# Patient Record
Sex: Female | Born: 1950 | Race: White | Hispanic: No | Marital: Married | Smoking: Never smoker
Health system: Southern US, Community
[De-identification: ages and names within clinical notes are randomized; demographics above are authoritative.]

## PROBLEM LIST (undated history)

## (undated) DIAGNOSIS — I10 Essential (primary) hypertension: Secondary | ICD-10-CM

## (undated) DIAGNOSIS — R519 Headache, unspecified: Secondary | ICD-10-CM

## (undated) DIAGNOSIS — E785 Hyperlipidemia, unspecified: Secondary | ICD-10-CM

## (undated) DIAGNOSIS — M1990 Unspecified osteoarthritis, unspecified site: Secondary | ICD-10-CM

## (undated) DIAGNOSIS — J329 Chronic sinusitis, unspecified: Secondary | ICD-10-CM

## (undated) DIAGNOSIS — Z923 Personal history of irradiation: Secondary | ICD-10-CM

## (undated) DIAGNOSIS — R0600 Dyspnea, unspecified: Secondary | ICD-10-CM

## (undated) DIAGNOSIS — R51 Headache: Secondary | ICD-10-CM

## (undated) DIAGNOSIS — K219 Gastro-esophageal reflux disease without esophagitis: Secondary | ICD-10-CM

## (undated) DIAGNOSIS — C50919 Malignant neoplasm of unspecified site of unspecified female breast: Secondary | ICD-10-CM

## (undated) DIAGNOSIS — Z8719 Personal history of other diseases of the digestive system: Secondary | ICD-10-CM

## (undated) HISTORY — PX: TONSILLECTOMY AND ADENOIDECTOMY: SHX28

## (undated) HISTORY — DX: Hyperlipidemia, unspecified: E78.5

## (undated) HISTORY — DX: Malignant neoplasm of unspecified site of unspecified female breast: C50.919

## (undated) HISTORY — PX: FINGER ARTHROPLASTY: SHX5017

## (undated) HISTORY — PX: CHOLECYSTECTOMY: SHX55

## (undated) HISTORY — PX: BREAST BIOPSY: SHX20

## (undated) HISTORY — PX: BREAST SURGERY: SHX581

## (undated) HISTORY — PX: TONSILLECTOMY: SUR1361

## (undated) HISTORY — PX: BUNIONECTOMY: SHX129

## (undated) HISTORY — PX: ABDOMINAL HYSTERECTOMY: SHX81

## (undated) HISTORY — DX: Unspecified osteoarthritis, unspecified site: M19.90

## (undated) HISTORY — DX: Chronic sinusitis, unspecified: J32.9

## (undated) HISTORY — DX: Gastro-esophageal reflux disease without esophagitis: K21.9

---

## 1999-08-05 HISTORY — PX: LASIK: SHX215

## 2008-08-04 HISTORY — PX: BREAST BIOPSY: SHX20

## 2010-08-04 DIAGNOSIS — C50919 Malignant neoplasm of unspecified site of unspecified female breast: Secondary | ICD-10-CM

## 2010-08-04 HISTORY — DX: Malignant neoplasm of unspecified site of unspecified female breast: C50.919

## 2010-08-04 HISTORY — PX: BREAST LUMPECTOMY: SHX2

## 2010-08-04 HISTORY — PX: BREAST BIOPSY: SHX20

## 2010-11-07 ENCOUNTER — Encounter: Payer: Self-pay | Admitting: Oncology

## 2012-01-27 ENCOUNTER — Telehealth: Payer: Self-pay | Admitting: *Deleted

## 2012-01-27 NOTE — Telephone Encounter (Signed)
Patient is moving from [place] to [place] and needs to obtain a Med Onc.  Requested Dr. Darnelle Catalan.  Confirmed 03/25/12 appt w/ pt.  Mailed before appt letter & packet to pt.  Took paperwork to Med Rec for chart.

## 2012-01-29 ENCOUNTER — Other Ambulatory Visit: Payer: Self-pay | Admitting: *Deleted

## 2012-01-29 DIAGNOSIS — C50919 Malignant neoplasm of unspecified site of unspecified female breast: Secondary | ICD-10-CM

## 2012-03-25 ENCOUNTER — Other Ambulatory Visit (HOSPITAL_BASED_OUTPATIENT_CLINIC_OR_DEPARTMENT_OTHER): Payer: BC Managed Care – PPO | Admitting: Lab

## 2012-03-25 ENCOUNTER — Ambulatory Visit (HOSPITAL_BASED_OUTPATIENT_CLINIC_OR_DEPARTMENT_OTHER): Payer: BC Managed Care – PPO | Admitting: Oncology

## 2012-03-25 ENCOUNTER — Ambulatory Visit: Payer: BC Managed Care – PPO

## 2012-03-25 VITALS — BP 149/83 | HR 87 | Temp 98.5°F | Resp 18 | Ht 64.5 in | Wt >= 6400 oz

## 2012-03-25 DIAGNOSIS — Z170 Estrogen receptor positive status [ER+]: Secondary | ICD-10-CM

## 2012-03-25 DIAGNOSIS — C50919 Malignant neoplasm of unspecified site of unspecified female breast: Secondary | ICD-10-CM

## 2012-03-25 LAB — CBC WITH DIFFERENTIAL/PLATELET
BASO%: 0.3 % (ref 0.0–2.0)
EOS%: 1.3 % (ref 0.0–7.0)
MCH: 29.1 pg (ref 25.1–34.0)
MCHC: 33.2 g/dL (ref 31.5–36.0)
RDW: 14.6 % — ABNORMAL HIGH (ref 11.2–14.5)
lymph#: 2.8 10*3/uL (ref 0.9–3.3)

## 2012-03-25 LAB — COMPREHENSIVE METABOLIC PANEL
ALT: 12 U/L (ref 0–35)
AST: 15 U/L (ref 0–37)
Albumin: 3.7 g/dL (ref 3.5–5.2)
Calcium: 9 mg/dL (ref 8.4–10.5)
Chloride: 104 mEq/L (ref 96–112)
Creatinine, Ser: 0.7 mg/dL (ref 0.50–1.10)
Potassium: 3.7 mEq/L (ref 3.5–5.3)

## 2012-03-25 LAB — CANCER ANTIGEN 27.29: CA 27.29: 5 U/mL (ref 0–39)

## 2012-03-25 MED ORDER — TAMOXIFEN CITRATE 20 MG PO TABS: 20.0000 mg | ORAL_TABLET | Freq: Every day | ORAL | Status: DC

## 2012-03-26 ENCOUNTER — Encounter: Payer: Self-pay | Admitting: Oncology

## 2012-03-26 DIAGNOSIS — C50912 Malignant neoplasm of unspecified site of left female breast: Secondary | ICD-10-CM | POA: Insufficient documentation

## 2012-03-26 NOTE — Progress Notes (Signed)
ID: [name]   DOB: Dec 02, 1950  MR#: [id number]  WUJ#:[id number]  HISTORY OF PRESENT ILLNESS: [place] Had routine screening mammography in February 2012 followed by ultrasonography confirming a suspicious abnormality in the left breast.. Needle guided biopsy was obtained and showed an invasive ductal carcinoma. 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  On 11/07/2010 the patient underwent left lumpectomy with sentinel lymph node sampling. The final pathology ([id number] at [facility] in [place]) showed an invasive ductal carcinoma, grade 1, measuring 7 mm, with ample margins and 0 of 3 sentinel lymph nodes for disease spread. The tumor was estrogen receptor positive at 95%, progesterone receptor negative, HER-2 negative at 1+ on the HercepTest.The patient received adjuvant radiation, then was started on letrozole, with poor tolerance(diarrhea, nausea, dizziness, and arthralgias). Tamoxifen was started may 2012, and she has tolerated this well.  INTERVAL HISTORY: The patient was seen in our clinic 03/25/2012, after moving to this area approximately 2 weeks prior of to live closer to her daughter and grandchildren.  REVIEW OF SYSTEMS: She is tolerating the tamoxifen with no side effects that she is aware of, and in particular she is having no problems with hot flashes. He has mild vaginal dryness which she does not describe as a major issue. She continues to have pain at the base of both palms. She also has a history of urinary tract infections, most recently April of 2013. Otherwise a detailed review of systems today was noncontributory.  PAST MEDICAL HISTORY: Past Medical History  Diagnosis Date  . Breast cancer   . Hyperlipidemia   . Osteoarthritis     thumbs  . GERD (gastroesophageal reflux disease)   . Osteopenia   . Chronic sinusitis     PAST SURGICAL HISTORY: Past Surgical  History  Procedure Date  . Tonsillectomy and adenoidectomy   . Bunionectomy   . Finger arthroplasty     thumb tendon replacement    FAMILY HISTORY No family history on file. The patient's father died at the age of 15 from a primary central nervous system tumor. The patient's mother died at age 62. Tamirra had no brothers. She has one sister. The only other cancers in the family were her father's mother, who died from colon cancer at the age of 9 and one maternal aunt who had breast cancer diagnosed at age 62 and bladder cancer at age 37.  GYNECOLOGIC HISTORY: Menarche age 53, first live birth age 27, she is GX P2. She went through menopause approximately 2001. She took hormone replacement for less than 2 years.  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 Her husband, [name]" L. Craghead, is a retired Animator. Daughter Etty Isaac lives in [place] and works as Armed forces logistics/support/administrative officer. Of Development for [employer]. She has 2 sons. Daughter Alvina Strother lives in [place] and works as a Furniture conservator/restorer for [employer]. The patient is a International aid/development worker.   ADVANCED DIRECTIVES: in place  HEALTH MAINTENANCE: History  Substance Use Topics  . Smoking status: Never Smoker   . Smokeless tobacco: Not on file  . Alcohol Use: No     Colonoscopy:  PAP: July 2013  Bone density:April of 2008, was normal  Lipid panel: July of 2013 showed a total cholesterol of 227 triglycerides 110 HDL 94 LDL 111  Allergies  Allergen Reactions  . Sulfa Antibiotics Itching and Rash  Current Outpatient Prescriptions  Medication Sig Dispense Refill  . cholecalciferol (VITAMIN D) 1000 UNITS tablet Take 1,000 Units by mouth daily.      Marland Kitchen esomeprazole (NEXIUM) 40 MG capsule Take 40 mg by mouth 3 (three) times a week.      . hydrOXYzine (ATARAX/VISTARIL) 25 MG tablet Take 25 mg by mouth daily.      . rosuvastatin (CRESTOR) 5 MG tablet Take 5 mg by mouth daily.      . tamoxifen (NOLVADEX)  20 MG tablet Take 1 tablet (20 mg total) by mouth daily.  90 tablet  12  . vitamin B-12 (CYANOCOBALAMIN) 100 MCG tablet Take 100 mcg by mouth daily.        OBJECTIVE:middle-aged white woman who appears well Filed Vitals:   03/25/12 1620  BP: 149/83  Pulse: 87  Temp: 98.5 F (36.9 C)  Resp: 18     Body mass index is 128.47 kg/(m^2).    ECOG FS: 0  Sclerae unicteric Oropharynx clear No cervical or supraclavicular adenopathy Lungs no rales or rhonchi Heart regular rate and rhythm Abd benign MSK no focal spinal tenderness, no peripheral edema Neuro: nonfocal Breasts: left breast is status post lumpectomy and radiation. There is no evidence of local recurrence. Right breast is unremarkable. Both axillae are benign.  LAB RESULTS: Lab Results  Component Value Date   WBC 6.4 03/25/2012   NEUTROABS 3.1 03/25/2012   HGB 13.0 03/25/2012   HCT 39.1 03/25/2012   MCV 87.6 03/25/2012   PLT 208 03/25/2012      Chemistry      Component Value Date/Time   NA 138 03/25/2012 1604   K 3.7 03/25/2012 1604   CL 104 03/25/2012 1604   CO2 25 03/25/2012 1604   BUN 9 03/25/2012 1604   CREATININE 0.70 03/25/2012 1604      Component Value Date/Time   CALCIUM 9.0 03/25/2012 1604   ALKPHOS 58 03/25/2012 1604   AST 15 03/25/2012 1604   ALT 12 03/25/2012 1604   BILITOT 0.2* 03/25/2012 1604       Lab Results  Component Value Date   LABCA2 5 03/25/2012    No components found with this basename: LABCA125    No results found for this basename: INR:1;PROTIME:1 in the last 168 hours  Urinalysis No results found for this basename: colorurine,  appearanceur,  labspec,  phurine,  glucoseu,  hgbur,  bilirubinur,  ketonesur,  proteinur,  urobilinogen,  nitrite,  leukocytesur    STUDIES: No additional results found.  ASSESSMENT: 61 y.o. woman recently moved to [place], [place], status post left lumpectomy and sentinel lymph node biopsy April of 2012 for a pT1 pN0, stage IA invasive ductal  carcinoma, grade 1, estrogen receptor positive, progesterone receptor and HER-2 negative, status post adjuvant radiation, briefly on letrozole with very poor tolerance, on tamoxifen since may of 2012 with good tolerance.  PLAN: we discussed her situation in detail and she understands with no adjuvant treatment of other than radiation her risk of death from this breast cancer would be in any case less than 3%. Her risk of recurrence would be in the 16% range according to the adjuvant! Database. I taking anti-estrogens she can cut that risk in half. We then discussed the multiple options available today and she is particularly interested in either taking tamoxifen for 5 years followed by an aromatase inhibitor for 5 years him a or continuing on tamoxifen for total of 10 years. I think either of these would be  very reasonable.  I have set her up for a transvaginal ultrasound pending her getting established with a gynecologist in this area. I refilled her tamoxifen prescription and again reviewed possible toxicities side effects and complications with her. She understands that if 5 little dryness is an issue, Vagifem suppositories would be reasonable in her situation. We will see her again in 6 months. Once she of has had a total of 2 years of followup, namely in the summer of next year, we will start seeing her on a once a year basis. Repeat mammography will be due August of 2014.   Aileena Iglesia C    03/26/2012

## 2012-03-29 ENCOUNTER — Telehealth: Payer: Self-pay | Admitting: Oncology

## 2012-03-29 NOTE — Telephone Encounter (Signed)
lmonvm advisng the pt of her US pelvis appt on 04/01/2012 along with the instructions.

## 2012-04-01 ENCOUNTER — Ambulatory Visit (HOSPITAL_COMMUNITY): Admission: RE | Admit: 2012-04-01 | Payer: BC Managed Care – PPO | Source: Ambulatory Visit

## 2012-04-01 ENCOUNTER — Ambulatory Visit (HOSPITAL_COMMUNITY)
Admission: RE | Admit: 2012-04-01 | Discharge: 2012-04-01 | Disposition: A | Discharge status: Home / Self Care | Payer: BC Managed Care – PPO | Source: Ambulatory Visit | Attending: Oncology | Admitting: Oncology

## 2012-04-01 DIAGNOSIS — R9389 Abnormal findings on diagnostic imaging of other specified body structures: Secondary | ICD-10-CM | POA: Insufficient documentation

## 2012-04-01 DIAGNOSIS — Z79899 Other long term (current) drug therapy: Secondary | ICD-10-CM | POA: Insufficient documentation

## 2012-04-01 DIAGNOSIS — C50919 Malignant neoplasm of unspecified site of unspecified female breast: Secondary | ICD-10-CM | POA: Insufficient documentation

## 2012-04-07 ENCOUNTER — Encounter: Payer: Self-pay | Admitting: *Deleted

## 2012-04-07 NOTE — Progress Notes (Signed)
Mailed after appt letter to pt. 

## 2012-04-11 ENCOUNTER — Other Ambulatory Visit: Payer: Self-pay | Admitting: Oncology

## 2012-04-19 ENCOUNTER — Other Ambulatory Visit: Payer: Self-pay | Admitting: *Deleted

## 2012-04-20 ENCOUNTER — Other Ambulatory Visit: Payer: Self-pay | Admitting: *Deleted

## 2012-05-04 ENCOUNTER — Encounter: Payer: Self-pay | Admitting: Obstetrics & Gynecology

## 2012-05-04 ENCOUNTER — Ambulatory Visit (INDEPENDENT_AMBULATORY_CARE_PROVIDER_SITE_OTHER): Payer: BC Managed Care – PPO | Admitting: Obstetrics & Gynecology

## 2012-05-04 VITALS — BP 148/96 | HR 102 | Temp 98.0°F | Resp 16 | Ht 64.0 in | Wt 160.0 lb

## 2012-05-04 DIAGNOSIS — IMO0002 Reserved for concepts with insufficient information to code with codable children: Secondary | ICD-10-CM

## 2012-05-04 DIAGNOSIS — R9389 Abnormal findings on diagnostic imaging of other specified body structures: Secondary | ICD-10-CM

## 2012-05-04 NOTE — Progress Notes (Signed)
  Subjective:    Patient ID: Kayla Spencer, female    DOB: [birth year], 61 y.o.   MRN: [id number]  HPI  61 year old female on Tamoxifen with baseline TV US by treating oncologist showing 5 mm thickening and questionable blood in cavity.  No postmenopausal bleeiding.    Review of Systems  Genitourinary: Positive for dyspareunia.  All other systems reviewed and are negative.       Objective:   Physical Exam  Vitals reviewed. Constitutional: She is oriented to person, place, and time. She appears well-developed and well-nourished.  HENT:  Head: Normocephalic and atraumatic.  Eyes: Conjunctivae normal are normal.  Pulmonary/Chest: Effort normal.  Abdominal: Soft. She exhibits no distension and no mass. There is no tenderness. There is no rebound and no guarding.  Genitourinary: Vagina normal.       Grade 2 prolapse.  No adnexal masses.  Musculoskeletal: She exhibits no edema.  Neurological: She is alert and oriented to person, place, and time.  Skin: Skin is warm and dry.  Psychiatric: She has a normal mood and affect. Her behavior is normal.          Assessment & Plan:  61 year old female with asymptomatic incidental finding of endometrial thickness of 5 mm on Tamoxifen  Discussed risks of endometrial hyperplasia, polyps, low positive predictive value of [place].  Pt would like to proceed with endometrial biopsy to confirm no hyperplasia or malignancy.  If biopsy returns negative then no further action is necessary unless she becomes symptomatic.  Thre is no evidence-based recommendations for uterine cancer screening in women taking tamoxifen.  ACOG recommend annual gyn exam for postmenopausal women.  Women should report any vaginal symptoms.

## 2012-09-20 ENCOUNTER — Other Ambulatory Visit: Payer: Self-pay | Admitting: *Deleted

## 2012-09-20 ENCOUNTER — Other Ambulatory Visit (HOSPITAL_BASED_OUTPATIENT_CLINIC_OR_DEPARTMENT_OTHER): Payer: BC Managed Care – PPO | Admitting: Lab

## 2012-09-20 DIAGNOSIS — C50919 Malignant neoplasm of unspecified site of unspecified female breast: Secondary | ICD-10-CM

## 2012-09-20 LAB — CBC WITH DIFFERENTIAL/PLATELET
BASO%: 0.4 % (ref 0.0–2.0)
Basophils Absolute: 0 10*3/uL (ref 0.0–0.1)
EOS%: 1.1 % (ref 0.0–7.0)
HGB: 13.9 g/dL (ref 11.6–15.9)
MCH: 28.2 pg (ref 25.1–34.0)
MCHC: 33.3 g/dL (ref 31.5–36.0)
MCV: 84.7 fL (ref 79.5–101.0)
MONO%: 5.3 % (ref 0.0–14.0)
NEUT%: 54.4 % (ref 38.4–76.8)
RDW: 14.8 % — ABNORMAL HIGH (ref 11.2–14.5)
lymph#: 2.7 10*3/uL (ref 0.9–3.3)

## 2012-09-20 LAB — COMPREHENSIVE METABOLIC PANEL (CC13)
Alkaline Phosphatase: 62 U/L (ref 40–150)
BUN: 12.8 mg/dL (ref 7.0–26.0)
Glucose: 88 mg/dl (ref 70–99)
Total Bilirubin: 0.32 mg/dL (ref 0.20–1.20)

## 2012-09-27 ENCOUNTER — Telehealth: Payer: Self-pay | Admitting: Oncology

## 2012-09-27 ENCOUNTER — Ambulatory Visit (HOSPITAL_BASED_OUTPATIENT_CLINIC_OR_DEPARTMENT_OTHER): Payer: BC Managed Care – PPO | Admitting: Oncology

## 2012-09-27 VITALS — BP 156/104 | HR 90 | Temp 98.3°F | Resp 20 | Ht 64.0 in | Wt 173.6 lb

## 2012-09-27 DIAGNOSIS — Z170 Estrogen receptor positive status [ER+]: Secondary | ICD-10-CM

## 2012-09-27 DIAGNOSIS — C50919 Malignant neoplasm of unspecified site of unspecified female breast: Secondary | ICD-10-CM

## 2012-09-27 DIAGNOSIS — M79609 Pain in unspecified limb: Secondary | ICD-10-CM

## 2012-09-27 NOTE — Telephone Encounter (Signed)
gv pt appt schedule for September and mammo appt @ [facility] for 8/5 @ 10:30am. S/w Kayla Spencer at the lymphedema clinic - Kayla Spencer has referral and will contact pt. Pt aware.

## 2012-09-27 NOTE — Progress Notes (Signed)
ID: [name]   DOB: [birth year]  MR#: [id number]  WUJ#:[id number]  PCP: Dorothey Baseman GYN: Elsie Lincoln  HISTORY OF PRESENT ILLNESS: Kayla Spencer routine screening mammography in February 2012 followed by ultrasonography confirming a suspicious abnormality in the left breast.. Needle guided biopsy was obtained and showed an invasive ductal carcinoma. 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  On 11/07/2010 the patient underwent left lumpectomy with sentinel lymph node sampling. The final pathology ([id number] at [facility] in [place]) showed an invasive ductal carcinoma, grade 1, measuring 7 mm, with ample margins and 0 of 3 sentinel lymph nodes for disease spread. The tumor was estrogen receptor positive at 95%, progesterone receptor negative, HER-2 negative at 1+ on the HercepTest.The patient received adjuvant radiation, then was started on letrozole, with poor tolerance(diarrhea, nausea, dizziness, and arthralgias). Tamoxifen was started may 2012, and she has tolerated this well.  INTERVAL HISTORY: Kayla Spencer returns for followup of her breast cancer. The interval history is unremarkable. They have completed their move to [facility] and are very please with the kind of in which they live. I have quite a bit of room and the yard. She exercises 4-5 times a week chiefly by walking, but on rainy days she makes it to the gym.  REVIEW OF SYSTEMS: Despite all this she is gaining weight. We discussed this at length today and she understands weight gain as well as 6 things at tends to happen to women at menopause. She has discomfort in the left axilla and the medial aspect of the left upper arm. This is going to be nerve damage secondary to her prior treatment. She has some hot flashes, which she does not think merit intervention. Otherwise a detailed review of systems today was  noncontributory  PAST MEDICAL HISTORY: Past Medical History  Diagnosis Date  . Breast cancer   . Hyperlipidemia   . Osteoarthritis     thumbs  . GERD (gastroesophageal reflux disease)   . Chronic sinusitis     PAST SURGICAL HISTORY: Past Surgical History  Procedure Laterality Date  . Tonsillectomy and adenoidectomy    . Bunionectomy    . Finger arthroplasty      thumb tendon replacement  . Breast surgery      lt lumpectomy x 2    FAMILY HISTORY Family History  Problem Relation Age of Onset  . Cancer Father     brain tumor  . Cancer Maternal Aunt     breast  . Cancer Paternal Grandmother     colon  . Heart disease Paternal Grandfather    The patient's father died at the age of 45 from a primary central nervous system tumor. The patient's mother died at age 62. Kayla Spencer Spencer no brothers. She has one sister. The only other cancers in the family were her father's mother, who died from colon cancer at the age of 27 and one maternal aunt who Spencer breast cancer diagnosed at age 39 and bladder cancer at age 64.  GYNECOLOGIC HISTORY: Menarche age 67, first live birth age 22, she is GX P2. She went through menopause approximately 2001. She took hormone replacement for less than 2 years.  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 Her husband, Kayla Spencer, is a retired Animator. Daughter Kayla Spencer lives in [place] and works as Armed forces logistics/support/administrative officer. Of Development for [employer]. She has 2 sons. Daughter  Kayla Spencer lives in [place] and works as a Furniture conservator/restorer for [employer]. The patient is a International aid/development worker.   ADVANCED DIRECTIVES: in place  HEALTH MAINTENANCE: History  Substance Use Topics  . Smoking status: Never Smoker   . Smokeless tobacco: Never Used  . Alcohol Use: No     Colonoscopy:  PAP: July 2013  Bone density:April of 2008, was normal  Lipid panel: July of 2013 showed a total cholesterol of 227 triglycerides 110 HDL 94 LDL  111  Allergies  Allergen Reactions  . Sulfa Antibiotics Itching and Rash    Current Outpatient Prescriptions  Medication Sig Dispense Refill  . cholecalciferol (VITAMIN D) 1000 UNITS tablet Take 1,000 Units by mouth daily.      Marland Kitchen esomeprazole (NEXIUM) 40 MG capsule Take 40 mg by mouth 3 (three) times a week.      Marland Kitchen FLUZONE injection       . hydrOXYzine (ATARAX/VISTARIL) 25 MG tablet Take 25 mg by mouth daily.      . rosuvastatin (CRESTOR) 5 MG tablet Take 5 mg by mouth daily.      . tamoxifen (NOLVADEX) 20 MG tablet Take 1 tablet (20 mg total) by mouth daily.  90 tablet  12  . vitamin B-12 (CYANOCOBALAMIN) 100 MCG tablet Take 100 mcg by mouth daily.       No current facility-administered medications for this visit.    OBJECTIVE:middle-aged white woman who appears well Filed Vitals:   09/27/12 1346  BP: 156/104  Pulse: 90  Temp: 98.3 F (36.8 C)  Resp: 20     Body mass index is 29.78 kg/(m^2).    ECOG FS: 0  Sclerae unicteric Oropharynx clear No cervical or supraclavicular adenopathy Lungs no rales or rhonchi Heart regular rate and rhythm Abd benign MSK no focal spinal tenderness, left upper extremity shows no edema, fair range of motion Neuro: nonfocal, well oriented, positive affect Breasts: left breast is status post lumpectomy and radiation. There is no evidence of local recurrence. The left axilla is benign Right breast is unremarkable.   LAB RESULTS: Lab Results  Component Value Date   WBC 7.0 09/20/2012   NEUTROABS 3.8 09/20/2012   HGB 13.9 09/20/2012   HCT 41.9 09/20/2012   MCV 84.7 09/20/2012   PLT 243 09/20/2012      Chemistry      Component Value Date/Time   NA 139 09/20/2012 1048   NA 138 03/25/2012 1604   K 4.3 09/20/2012 1048   K 3.7 03/25/2012 1604   CL 108* 09/20/2012 1048   CL 104 03/25/2012 1604   CO2 23 09/20/2012 1048   CO2 25 03/25/2012 1604   BUN 12.8 09/20/2012 1048   BUN 9 03/25/2012 1604   CREATININE 0.8 09/20/2012 1048   CREATININE 0.70  03/25/2012 1604      Component Value Date/Time   CALCIUM 9.1 09/20/2012 1048   CALCIUM 9.0 03/25/2012 1604   ALKPHOS 62 09/20/2012 1048   ALKPHOS 58 03/25/2012 1604   AST 22 09/20/2012 1048   AST 15 03/25/2012 1604   ALT 30 09/20/2012 1048   ALT 12 03/25/2012 1604   BILITOT 0.32 09/20/2012 1048   BILITOT 0.2* 03/25/2012 1604       Lab Results  Component Value Date   LABCA2 5 03/25/2012    No components found with this basename: [id number]    No results found for this basename: INR,  in the last 168 hours  Urinalysis No results found for  this basename: colorurine,  appearanceur,  labspec,  phurine,  glucoseu,  hgbur,  bilirubinur,  ketonesur,  proteinur,  urobilinogen,  nitrite,  leukocytesur    STUDIES: Collection Date: 05/04/2012 Patient Name: Kayla Spencer, Kayla Spencer Received Date: 05/05/2012 Case No: [id number] Physician cc: MRN #: [id number] Chart #: [id number] DOB: 22-May-1951 Age: 79 Gender: F Client Name: [facility] Physician: Elsie Lincoln, MD REPORT OF SURGICAL PATHOLOGY FINAL DIAGNOSIS Diagnosis Endometrium, biopsy - SCANT FRAGMENTS OF BENIGN ENDOCERVICAL MUCOSA, SEE COMMENT. - MINUTE SUPERFICIAL ENDOMETRIAL GLANDS AND STROMA; NO ATYPIA OR MALIGNANCY PRESENT. Microscopic Comment The specimen consists almost entirely of blood and fibrin. (CRR:eps 05/06/12) [place] RUND DO Pathologist, Electronic Signature (Case signed 05/06/2012) Specimen Gross and Clinical Information Specimen Comment Thickened endometrium on postmenopausal woman Specimen(s) Obtained: Endometrium, biopsy Gross Received in formalin are tan, hemorrhagic soft tissue fragments that are entirely submitted. Volume: 2.3 x 1.6 x 0.2 cm One block (OA:jes) Report signed out from the following location(s) [facility] [address],[place] [address].CLIA:[id number]., [facility] [address] [place], [place], [place] [address]. CLIA #:  [id number], 1 of  ASSESSMENT: 62 y.o. woman recently moved to [place], [place], status post left lumpectomy and sentinel lymph node biopsy April of 2012 for a pT1 pN0, stage IA invasive ductal carcinoma, grade 1, estrogen receptor positive, progesterone receptor and HER-2 negative, status post adjuvant radiation, briefly on letrozole with very poor tolerance, on tamoxifen since may of 2012 with good tolerance.  PLAN: There is no evidence of disease recurrence and she is tolerating the tamoxifen well. The plan is to continue tamoxifen for 5 years and then reassess whether we want to continuing to 10 or switch to an aromatase inhibitor.  I think she would benefit from going to the lymphedema clinic, chiefly to learn how to massage the left arm and some range of motion exercises. I have set that up. She will be due for mammography in August and she will have that at the [facility] here in town. Otherwise she will see [place] again in September and from that point we will start seeing her on a once a year basis. She knows to call for any problems that may develop before the next visit  Johnny Gorter C    09/27/2012

## 2012-10-06 ENCOUNTER — Ambulatory Visit: Payer: BC Managed Care – PPO | Attending: Oncology | Admitting: Physical Therapy

## 2012-10-06 DIAGNOSIS — M25619 Stiffness of unspecified shoulder, not elsewhere classified: Secondary | ICD-10-CM | POA: Insufficient documentation

## 2012-10-06 DIAGNOSIS — IMO0001 Reserved for inherently not codable concepts without codable children: Secondary | ICD-10-CM | POA: Insufficient documentation

## 2012-10-06 DIAGNOSIS — I890 Lymphedema, not elsewhere classified: Secondary | ICD-10-CM | POA: Insufficient documentation

## 2012-10-06 DIAGNOSIS — C50919 Malignant neoplasm of unspecified site of unspecified female breast: Secondary | ICD-10-CM | POA: Insufficient documentation

## 2012-10-12 ENCOUNTER — Ambulatory Visit: Payer: BC Managed Care – PPO

## 2012-10-12 ENCOUNTER — Other Ambulatory Visit: Payer: Self-pay | Admitting: *Deleted

## 2012-10-12 DIAGNOSIS — C50919 Malignant neoplasm of unspecified site of unspecified female breast: Secondary | ICD-10-CM

## 2012-10-12 NOTE — Progress Notes (Signed)
Pt called to this RN wanting to clarify when next mammogram should be done.  MD ordered for August of this year - but she received a letter from imaging center she obtained last mammogram stating need to be done in March 2014. Last mammogram was March 2013.  This RN placed request for mammo to be rescheduled to March and sent to scheduling.  Due to transfer of care from [place]- this RN faxed mammo order with prior reports to [place].  All the above discussed with pt.

## 2012-10-14 ENCOUNTER — Ambulatory Visit: Payer: BC Managed Care – PPO | Admitting: Physical Therapy

## 2012-10-18 ENCOUNTER — Telehealth: Payer: Self-pay | Admitting: Oncology

## 2012-10-18 NOTE — Telephone Encounter (Signed)
S/w the pt and she is aware of her march mammogram appt that has been r/s from aug to march at the [facility] women[facility] clinic.

## 2012-10-19 ENCOUNTER — Ambulatory Visit: Payer: BC Managed Care – PPO | Admitting: Physical Therapy

## 2012-10-21 ENCOUNTER — Ambulatory Visit: Payer: BC Managed Care – PPO

## 2012-10-26 ENCOUNTER — Ambulatory Visit: Payer: BC Managed Care – PPO | Admitting: Physical Therapy

## 2012-10-28 ENCOUNTER — Ambulatory Visit: Payer: BC Managed Care – PPO

## 2012-11-02 ENCOUNTER — Ambulatory Visit: Payer: BC Managed Care – PPO | Attending: Oncology

## 2012-11-02 DIAGNOSIS — I890 Lymphedema, not elsewhere classified: Secondary | ICD-10-CM | POA: Insufficient documentation

## 2012-11-02 DIAGNOSIS — C50919 Malignant neoplasm of unspecified site of unspecified female breast: Secondary | ICD-10-CM | POA: Insufficient documentation

## 2012-11-02 DIAGNOSIS — IMO0001 Reserved for inherently not codable concepts without codable children: Secondary | ICD-10-CM | POA: Insufficient documentation

## 2012-11-02 DIAGNOSIS — M25619 Stiffness of unspecified shoulder, not elsewhere classified: Secondary | ICD-10-CM | POA: Insufficient documentation

## 2012-11-04 ENCOUNTER — Ambulatory Visit: Payer: BC Managed Care – PPO | Admitting: Physical Therapy

## 2013-03-07 ENCOUNTER — Telehealth: Payer: Self-pay | Admitting: *Deleted

## 2013-03-16 ENCOUNTER — Telehealth: Payer: Self-pay | Admitting: *Deleted

## 2013-03-16 NOTE — Telephone Encounter (Signed)
Patient called and left message that she had previously called [place] on 8/4 and needed her mammogram scheduled. Attempted to call patient back to see if there are any new findings, otherwise her annual mammo is scheduled for March 2015. Left the number for [facility] for patient to schedule this, if there are problems or new findings, then she should call us back and speak with nurse.

## 2013-03-17 ENCOUNTER — Other Ambulatory Visit: Payer: Self-pay | Admitting: *Deleted

## 2013-03-18 ENCOUNTER — Other Ambulatory Visit: Payer: Self-pay | Admitting: *Deleted

## 2013-03-18 DIAGNOSIS — C50919 Malignant neoplasm of unspecified site of unspecified female breast: Secondary | ICD-10-CM

## 2013-03-18 NOTE — Progress Notes (Signed)
This RN spoke with pt per her call earlier with concern that she thought she was to obtain a 2nd mammogram before MD appointment in September.  [place]ez had mammogram in April with recommendation for repeat in 1 year.  Per discussion Deaisha states she was given an appointment for mammogram in August 2014 ( though she was already scheduled for April of same year ).  " Now I am being told that insurance won't pay but I thought Dr Darnelle Catalan wanted me to have it again ".  This RN reviewed pt's chart - and informed her above was a clerical misunderstanding - mammogram are done yearly unless she is having new issues of concern. Per inquiry pt denies any new issues " except I guess just my anxiety ".  This RN validated pt's concern with discussion of availability to call this office if new concerns arise that warranted additional scans.  Presently pt is scheduled for follow up in September. Of note pt also asked about obtaining a CA 27.29 due to previous testing.  Order will be placed for CA 27.29.

## 2013-04-04 ENCOUNTER — Emergency Department: Payer: Self-pay | Admitting: Emergency Medicine

## 2013-04-04 LAB — COMPREHENSIVE METABOLIC PANEL
Alkaline Phosphatase: 74 U/L (ref 50–136)
Anion Gap: 8 (ref 7–16)
Bilirubin,Total: 0.3 mg/dL (ref 0.2–1.0)
Chloride: 111 mmol/L — ABNORMAL HIGH (ref 98–107)
EGFR (African American): >60
EGFR (Non-African Amer.): >60
Glucose: 135 mg/dL — ABNORMAL HIGH (ref 65–99)
SGOT(AST): 40 U/L — ABNORMAL HIGH (ref 15–37)
Total Protein: 6.3 g/dL — ABNORMAL LOW (ref 6.4–8.2)

## 2013-04-04 LAB — CK TOTAL AND CKMB (NOT AT ARMC): CK, Total: 46 U/L (ref 21–215)

## 2013-04-04 LAB — PROTIME-INR: INR: 0.9

## 2013-04-04 LAB — CBC WITH DIFFERENTIAL/PLATELET
Basophil #: 0 10*3/uL (ref 0.0–0.1)
Eosinophil #: 0.1 10*3/uL (ref 0.0–0.7)
HCT: 40.1 % (ref 35.0–47.0)
HGB: 13.4 g/dL (ref 12.0–16.0)
Lymphocyte %: 22.9 %
MCH: 28.6 pg (ref 26.0–34.0)
MCHC: 33.3 g/dL (ref 32.0–36.0)
MCV: 86 fL (ref 80–100)
Neutrophil %: 71.6 %
WBC: 9.4 10*3/uL (ref 3.6–11.0)

## 2013-04-04 LAB — LIPASE, BLOOD: Lipase: 361 U/L (ref 73–393)

## 2013-04-04 LAB — TROPONIN I
Troponin-I: <0.02 ng/mL
Troponin-I: <0.02 ng/mL

## 2013-04-18 ENCOUNTER — Encounter: Payer: Self-pay | Admitting: Physician Assistant

## 2013-04-18 ENCOUNTER — Telehealth: Payer: Self-pay | Admitting: *Deleted

## 2013-04-18 ENCOUNTER — Other Ambulatory Visit (HOSPITAL_BASED_OUTPATIENT_CLINIC_OR_DEPARTMENT_OTHER): Payer: BC Managed Care – PPO | Admitting: Lab

## 2013-04-18 ENCOUNTER — Ambulatory Visit (HOSPITAL_BASED_OUTPATIENT_CLINIC_OR_DEPARTMENT_OTHER): Payer: BC Managed Care – PPO | Admitting: Physician Assistant

## 2013-04-18 VITALS — BP 149/91 | HR 89 | Temp 98.2°F | Resp 18 | Ht 64.0 in | Wt 164.8 lb

## 2013-04-18 DIAGNOSIS — Z853 Personal history of malignant neoplasm of breast: Secondary | ICD-10-CM

## 2013-04-18 DIAGNOSIS — C50912 Malignant neoplasm of unspecified site of left female breast: Secondary | ICD-10-CM

## 2013-04-18 DIAGNOSIS — C50919 Malignant neoplasm of unspecified site of unspecified female breast: Secondary | ICD-10-CM

## 2013-04-18 LAB — COMPREHENSIVE METABOLIC PANEL (CC13)
AST: 14 U/L (ref 5–34)
Alkaline Phosphatase: 54 U/L (ref 40–150)
BUN: 9.6 mg/dL (ref 7.0–26.0)
Calcium: 9.2 mg/dL (ref 8.4–10.4)
Creatinine: 0.7 mg/dL (ref 0.6–1.1)

## 2013-04-18 LAB — CBC WITH DIFFERENTIAL/PLATELET
Basophils Absolute: 0 10*3/uL (ref 0.0–0.1)
Eosinophils Absolute: 0.1 10*3/uL (ref 0.0–0.5)
HGB: 13.3 g/dL (ref 11.6–15.9)
LYMPH%: 39.3 % (ref 14.0–49.7)
MCV: 85.9 fL (ref 79.5–101.0)
MONO%: 5.9 % (ref 0.0–14.0)
NEUT#: 3.9 10*3/uL (ref 1.5–6.5)
Platelets: 271 10*3/uL (ref 145–400)
RDW: 14.5 % (ref 11.2–14.5)

## 2013-04-18 NOTE — Telephone Encounter (Signed)
appts made and printed. gv appt for [facility]...td 

## 2013-04-18 NOTE — Progress Notes (Signed)
ID: [name]   DOB: 1950/11/07  MR#: [id number]  WUX#:[id number]  PCP: Dorothey Baseman GYN: Elsie Lincoln   CHIEF COMPLAINT:  Left breast cancer   HISTORY OF PRESENT ILLNESS: Kayla Spencer Had routine screening mammography in February 2012 followed by ultrasonography confirming a suspicious abnormality in the left breast.. Needle guided biopsy was obtained and showed an invasive ductal carcinoma. 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  On 11/07/2010 the patient underwent left lumpectomy with sentinel lymph node sampling. The final pathology ([id number] at [facility] in [place]) showed an invasive ductal carcinoma, grade 1, measuring 7 mm, with ample margins and 0 of 3 sentinel lymph nodes for disease spread. The tumor was estrogen receptor positive at 95%, progesterone receptor negative, HER-2 negative at 1+ on the HercepTest.The patient received adjuvant radiation, then was started on letrozole, with poor tolerance(diarrhea, nausea, dizziness, and arthralgias). Tamoxifen was started may 2012, and she has tolerated this well.  INTERVAL HISTORY: Kayla Spencer returns alone today for followup of her left breast cancer.  She continues on tamoxifen which she is tolerating well.   The interval history is notable for the fact that Kayla Spencer was seen in the emergency room at [facility] on 04/04/2013 with complaints of chest pain at 4:30 AM. She was evaluated thoroughly. They did not find this pain to be associated with any cardiac problems, and she has been referred to a surgeon for further evaluation of her gallbladder. In fact she is scheduled for nuclear testing of the gallbladder next week. She's had no significant episodes of abdominal pain since that time, although her abdomen is slightly tender to palpation. She's had no nausea or emesis, and is having regular bowel movements.  Kayla Spencer is very busy,  and exercises on a regular basis. She travels quite a bit, and since her appointment here in February, has been to [place] and also to [facility].   REVIEW OF SYSTEMS: Kayla Spencer denies any recent fevers or chills. She has no significant hot flashes. Her energy level is good. She's had no skin changes, rashes, abnormal bruising, or abnormal bleeding. She denies any vaginal dryness, discharge, or bleeding. She's eating well and has a good appetite. She's had no cough, shortness of breath, or additional chest pain. She denies any abnormal headaches, change in vision, or dizziness. She also denies any new or unusual myalgias, arthralgias, bony pain, or peripheral swelling.  A detailed review of systems is otherwise stable and noncontributory.   PAST MEDICAL HISTORY: Past Medical History  Diagnosis Date  . Breast cancer   . Hyperlipidemia   . Osteoarthritis     thumbs  . GERD (gastroesophageal reflux disease)   . Chronic sinusitis     PAST SURGICAL HISTORY: Past Surgical History  Procedure Laterality Date  . Tonsillectomy and adenoidectomy    . Bunionectomy    . Finger arthroplasty      thumb tendon replacement  . Breast surgery      lt lumpectomy x 2    FAMILY HISTORY Family History  Problem Relation Age of Onset  . Cancer Father     brain tumor  . Cancer Maternal Aunt     breast  . Cancer Paternal Grandmother     colon  . Heart disease Paternal Grandfather    The patient's father died at the age of 105 from a primary central nervous system tumor. The patient's mother died at  age 80. Kayla Spencer had no brothers. She has one sister. The only other cancers in the family were her father's mother, who died from colon cancer at the age of 38 and one maternal aunt who had breast cancer diagnosed at age 52 and bladder cancer at age 37.  GYNECOLOGIC HISTORY: Menarche age 41, first live birth age 30, she is [place] P2. She went through menopause approximately 2001. She took hormone replacement  for less than 2 years.  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 Her husband, Kayla Kuba" L. Spencer, is a retired Animator. Daughter Kayla Spencer lives in [place] and works as Armed forces logistics/support/administrative officer. Of Development for [employer]. She has 2 sons. Daughter Kayla Spencer lives in [place] and works as a Furniture conservator/restorer for [employer]. The patient is a International aid/development worker.   ADVANCED DIRECTIVES: in place  HEALTH MAINTENANCE: History  Substance Use Topics  . Smoking status: Never Smoker   . Smokeless tobacco: Never Used  . Alcohol Use: No     Colonoscopy:  PAP: July 2013  Bone density:April of 2008, was normal  Lipid panel: July of 2013 showed a total cholesterol of 227 triglycerides 110 HDL 94 LDL 111  Allergies  Allergen Reactions  . Sulfa Antibiotics Itching and Rash    Current Outpatient Prescriptions  Medication Sig Dispense Refill  . Biotin 1000 MCG tablet Take 1,000 mcg by mouth daily.      . cholecalciferol (VITAMIN D) 1000 UNITS tablet Take 1,000 Units by mouth daily.      . diclofenac (VOLTAREN) 75 MG EC tablet Take 75 mg by mouth 2 (two) times daily as needed.      Marland Kitchen esomeprazole (NEXIUM) 40 MG capsule Take 40 mg by mouth 3 (three) times a week.      . hydrOXYzine (ATARAX/VISTARIL) 25 MG tablet Take 25 mg by mouth daily.      . tamoxifen (NOLVADEX) 20 MG tablet Take 1 tablet (20 mg total) by mouth daily.  90 tablet  12  . vitamin B-12 (CYANOCOBALAMIN) 100 MCG tablet Take 100 mcg by mouth daily.       No current facility-administered medications for this visit.    OBJECTIVE:middle-aged white woman who appears well Filed Vitals:   04/18/13 1457  BP: 149/91  Pulse: 89  Temp: 98.2 F (36.8 C)  Resp: 18     Body mass index is 28.27 kg/(m^2).    ECOG FS: 0 Filed Weights   04/18/13 1457  Weight: 164 lb 12.8 oz (74.753 kg)   Sclerae unicteric Oropharynx clear, buccal mucosa is pink and moist No cervical or supraclavicular adenopathy Lungs clear to  auscultation bilaterally with good excursion, no rales or rhonchi Heart regular rate and rhythm, no murmur appreciated Abd soft, mild tenderness to palpation in the right upper quadrant, but no organomegaly or masses palpated, positive bowel sounds. MSK no focal spinal tenderness, No peripheral edema Neuro: nonfocal, well oriented, positive affect Breasts: left breast is status post lumpectomy and radiation. Other than normal scar tissue around the incision, there no abnormalities, and the left breast is unremarkable. There is no evidence of local recurrence.  Right breast is unremarkable. Axillae are benign bilaterally with no palpable adenopathy  LAB RESULTS: Lab Results  Component Value Date   WBC 7.3 04/18/2013   NEUTROABS 3.9 04/18/2013   HGB 13.3 04/18/2013   HCT 40.7 04/18/2013   MCV 85.9 04/18/2013   PLT 271 04/18/2013      Chemistry  Component Value Date/Time   NA 141 04/18/2013 1442   NA 138 03/25/2012 1604   K 3.9 04/18/2013 1442   K 3.7 03/25/2012 1604   CL 108* 09/20/2012 1048   CL 104 03/25/2012 1604   CO2 24 04/18/2013 1442   CO2 25 03/25/2012 1604   BUN 9.6 04/18/2013 1442   BUN 9 03/25/2012 1604   CREATININE 0.7 04/18/2013 1442   CREATININE 0.70 03/25/2012 1604      Component Value Date/Time   CALCIUM 9.2 04/18/2013 1442   CALCIUM 9.0 03/25/2012 1604   ALKPHOS 54 04/18/2013 1442   ALKPHOS 58 03/25/2012 1604   AST 14 04/18/2013 1442   AST 15 03/25/2012 1604   ALT 13 04/18/2013 1442   ALT 12 03/25/2012 1604   BILITOT 0.27 04/18/2013 1442   BILITOT 0.2* 03/25/2012 1604       Lab Results  Component Value Date   LABCA2 5 03/25/2012   CA 27.29 was repeated today, 04/18/2013, per patient request. Results are pending.   STUDIES:  Most recent bilateral mammogram at [facility] on 10/26/2012 was unremarkable. These results were reviewed with the patient today.   ASSESSMENT: 62 y.o. woman recently moved from [place] to [place], [place]  (1)   status post left  lumpectomy and sentinel lymph node biopsy April of 2012 for a pT1 pN0, stage IA invasive ductal carcinoma, grade 1, estrogen receptor positive, progesterone receptor and HER-2 negative,   (2)  status post adjuvant radiation   (3)  briefly on letrozole with very poor tolerance, on tamoxifen since May of 2012 with good tolerance.   PLAN:  With regards to her breast cancer, Kayla Spencer appears to be doing well, and there is no clinical evidence of disease recurrence. She'll continue on tamoxifen. Our plan is to continue for at least 5 years, then reevaluate whether to continue for 10 years.  Kayla Spencer will be due for her next mammogram at [facility] in late March 2015. We will see her soon thereafter in April for routine followup, and if she is doing as well then as she is now, we will likely go to annual visits.  All this was reviewed in detail with Kayla Spencer today, and she voices understanding and agreement with our plan.  She will call with any changes or problems prior to her next scheduled visit.  Kayla Spencer    04/18/2013

## 2013-04-25 ENCOUNTER — Ambulatory Visit: Payer: Self-pay | Admitting: Surgery

## 2013-05-10 ENCOUNTER — Ambulatory Visit: Payer: Self-pay | Admitting: Surgery

## 2013-05-12 LAB — PATHOLOGY REPORT

## 2013-06-07 ENCOUNTER — Ambulatory Visit (INDEPENDENT_AMBULATORY_CARE_PROVIDER_SITE_OTHER): Payer: BC Managed Care – PPO | Admitting: Obstetrics & Gynecology

## 2013-06-07 ENCOUNTER — Encounter: Payer: Self-pay | Admitting: Obstetrics & Gynecology

## 2013-06-07 VITALS — BP 155/87 | HR 79 | Resp 16 | Ht 64.0 in | Wt 160.0 lb

## 2013-06-07 DIAGNOSIS — Z124 Encounter for screening for malignant neoplasm of cervix: Secondary | ICD-10-CM

## 2013-06-07 DIAGNOSIS — I10 Essential (primary) hypertension: Secondary | ICD-10-CM

## 2013-06-07 DIAGNOSIS — Z01419 Encounter for gynecological examination (general) (routine) without abnormal findings: Secondary | ICD-10-CM

## 2013-06-07 DIAGNOSIS — IMO0001 Reserved for inherently not codable concepts without codable children: Secondary | ICD-10-CM | POA: Insufficient documentation

## 2013-06-07 DIAGNOSIS — R350 Frequency of micturition: Secondary | ICD-10-CM

## 2013-06-07 DIAGNOSIS — Z1151 Encounter for screening for human papillomavirus (HPV): Secondary | ICD-10-CM

## 2013-06-07 NOTE — Progress Notes (Signed)
  Subjective:    Kayla Spencer is a 62 y.o. female who presents for an annual exam. The patient has no complaints today. The patient is sexually active. GYN screening history: last pap: was normal. The patient wears seatbelts: yes. The patient participates in regular exercise: yes. Has the patient ever been transfused or tattooed?: no. The patient reports that there is not domestic violence in her life.   Menstrual History: OB History   Grav Para Term Preterm Abortions TAB SAB Ect Mult Living   2 2 2       2       No LMP recorded. Patient is postmenopausal.    The following portions of the patient's history were reviewed and updated as appropriate: allergies, current medications, past family history, past medical history, past social history, past surgical history and problem list.  Review of Systems A comprehensive review of systems was negative.  No vaginal bleeding on Tamoxifen.   Objective:      Filed Vitals:   06/07/13 1422  BP: 155/87  Pulse: 79  Resp: 16  Height: 5\' 4"  (1.626 m)  Weight: 160 lb (72.576 kg)   Vitals:  WNL General appearance: alert, cooperative and no distress Head: Normocephalic, without obvious abnormality, atraumatic Eyes: negative Throat: lips, mucosa, and tongue normal; teeth and gums normal Lungs: clear to auscultation bilaterally Breasts: normal appearance, no masses or tenderness, No nipple retraction or dimpling, No nipple discharge or bleeding Heart: regular rate and rhythm Abdomen: soft, non-tender; bowel sounds normal; no masses,  no organomegaly Pelvic: cervix normal in appearance, external genitalia normal, no adnexal masses or tenderness, no bladder tenderness, no cervical motion tenderness, perianal skin: no external genital warts noted, rectovaginal septum normal, urethra without abnormality or discharge, uterus normal size, shape, and consistency and vagina normal without discharge with pal pink mucosa Extremities: no edema, redness or  tenderness in the calves or thighs Skin: no lesions or rash Lymph nodes: Axillary adenopathy: none     .    Assessment:    Healthy female exam.    Plan:     Pap smear. with HPV testing Followed by oncology for breast cancer Primary care MD for other screening.

## 2013-06-08 LAB — CULTURE, URINE COMPREHENSIVE
Colony Count: NO GROWTH
Organism ID, Bacteria: NO GROWTH

## 2013-06-09 ENCOUNTER — Telehealth: Payer: Self-pay | Admitting: *Deleted

## 2013-06-09 NOTE — Telephone Encounter (Signed)
Pt notified via phone of neg urine culture

## 2013-06-20 ENCOUNTER — Other Ambulatory Visit: Payer: Self-pay | Admitting: Oncology

## 2013-06-20 DIAGNOSIS — C50919 Malignant neoplasm of unspecified site of unspecified female breast: Secondary | ICD-10-CM

## 2013-10-31 ENCOUNTER — Other Ambulatory Visit (HOSPITAL_BASED_OUTPATIENT_CLINIC_OR_DEPARTMENT_OTHER): Payer: BC Managed Care – PPO

## 2013-10-31 DIAGNOSIS — Z853 Personal history of malignant neoplasm of breast: Secondary | ICD-10-CM

## 2013-10-31 DIAGNOSIS — C50912 Malignant neoplasm of unspecified site of left female breast: Secondary | ICD-10-CM

## 2013-10-31 LAB — CBC WITH DIFFERENTIAL/PLATELET
BASO%: 0.7 % (ref 0.0–2.0)
Basophils Absolute: 0 10*3/uL (ref 0.0–0.1)
EOS%: 1.9 % (ref 0.0–7.0)
Eosinophils Absolute: 0.1 10*3/uL (ref 0.0–0.5)
HCT: 42.8 % (ref 34.8–46.6)
HGB: 14 g/dL (ref 11.6–15.9)
LYMPH%: 36.3 % (ref 14.0–49.7)
MCH: 27.7 pg (ref 25.1–34.0)
MCHC: 32.7 g/dL (ref 31.5–36.0)
MCV: 84.8 fL (ref 79.5–101.0)
MONO#: 0.3 10*3/uL (ref 0.1–0.9)
MONO%: 6.7 % (ref 0.0–14.0)
NEUT#: 2.6 10*3/uL (ref 1.5–6.5)
NEUT%: 54.4 % (ref 38.4–76.8)
Platelets: 174 10*3/uL (ref 145–400)
RBC: 5.05 10*6/uL (ref 3.70–5.45)
RDW: 15.1 % — ABNORMAL HIGH (ref 11.2–14.5)
WBC: 4.8 10*3/uL (ref 3.9–10.3)
lymph#: 1.7 10*3/uL (ref 0.9–3.3)

## 2013-10-31 LAB — COMPREHENSIVE METABOLIC PANEL (CC13)
ALT: 9 U/L (ref 0–55)
ANION GAP: 9 meq/L (ref 3–11)
AST: 11 U/L (ref 5–34)
Albumin: 3.4 g/dL — ABNORMAL LOW (ref 3.5–5.0)
Alkaline Phosphatase: 73 U/L (ref 40–150)
BILIRUBIN TOTAL: 0.28 mg/dL (ref 0.20–1.20)
BUN: 11.4 mg/dL (ref 7.0–26.0)
CO2: 20 meq/L — AB (ref 22–29)
CREATININE: 0.9 mg/dL (ref 0.6–1.1)
Calcium: 8.8 mg/dL (ref 8.4–10.4)
Chloride: 110 mEq/L — ABNORMAL HIGH (ref 98–109)
Glucose: 162 mg/dl — ABNORMAL HIGH (ref 70–140)
Potassium: 3.7 mEq/L (ref 3.5–5.1)
Sodium: 140 mEq/L (ref 136–145)
Total Protein: 7.4 g/dL (ref 6.4–8.3)

## 2013-11-07 ENCOUNTER — Ambulatory Visit (HOSPITAL_BASED_OUTPATIENT_CLINIC_OR_DEPARTMENT_OTHER): Payer: BC Managed Care – PPO | Admitting: Oncology

## 2013-11-07 ENCOUNTER — Telehealth: Payer: Self-pay | Admitting: Physician Assistant

## 2013-11-07 ENCOUNTER — Other Ambulatory Visit: Payer: BC Managed Care – PPO | Admitting: Lab

## 2013-11-07 VITALS — BP 148/87 | HR 84 | Temp 98.5°F | Resp 18 | Ht 64.0 in | Wt 163.4 lb

## 2013-11-07 DIAGNOSIS — Z170 Estrogen receptor positive status [ER+]: Secondary | ICD-10-CM

## 2013-11-07 DIAGNOSIS — IMO0001 Reserved for inherently not codable concepts without codable children: Secondary | ICD-10-CM

## 2013-11-07 DIAGNOSIS — C50919 Malignant neoplasm of unspecified site of unspecified female breast: Secondary | ICD-10-CM

## 2013-11-07 MED ORDER — TAMOXIFEN CITRATE 20 MG PO TABS
20.0000 mg | ORAL_TABLET | Freq: Every day | ORAL | Status: DC
Start: 2013-11-07 — End: 2014-11-08

## 2013-11-07 NOTE — Progress Notes (Signed)
ID: Demetrios Isaacs   DOB: Dec 02, 1950  MR#: [id number]  WUJ#:[id number]  PCP: Juluis Pitch GYN: Silas Sacramento SU: Bobette Mo   CHIEF COMPLAINT:  Left breast cancer   HISTORY OF PRESENT ILLNESS: Amandalee Had routine screening mammography in February 2012 followed by ultrasonography confirming a suspicious abnormality in the left breast.. Needle guided biopsy was obtained and showed an invasive ductal carcinoma. 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  On 11/07/2010 the patient underwent left lumpectomy with sentinel lymph node sampling. The final pathology ([id number] at [facility] in [place]) showed an invasive ductal carcinoma, grade 1, measuring 7 mm, with ample margins and 0 of 3 sentinel lymph nodes for disease spread. The tumor was estrogen receptor positive at 95%, progesterone receptor negative, HER-2 negative at 1+ on the HercepTest.The patient received adjuvant radiation, then was started on letrozole, with poor tolerance(diarrhea, nausea, dizziness, and arthralgias). Tamoxifen was started may 2012, and she has tolerated this well.  INTERVAL HISTORY: Kaiana returns today for followup of her low risk breast cancer. The interval history is unremarkable. She is tolerating the tamoxifen with no side effects it she is aware of including no hot flashes and no vaginal "stickiness". Since her last visit here she did have her gallbladder removed. There were no complications  REVIEW OF SYSTEMS: Thayer Headings d has mild arthritic discomforts here in there, including a the right pretibial discomfort which has been evaluated by vascular surgery. She is in the process of referral to orthopedics. This doesn't keep her from walking though and she walks 20-40 minutes almost every day of the week. A detailed review of systems today was otherwise entirely negative  PAST MEDICAL HISTORY: Past  Medical History  Diagnosis Date  . Breast cancer   . Hyperlipidemia   . Osteoarthritis     thumbs  . GERD (gastroesophageal reflux disease)   . Chronic sinusitis     PAST SURGICAL HISTORY: Past Surgical History  Procedure Laterality Date  . Tonsillectomy and adenoidectomy    . Bunionectomy    . Finger arthroplasty      thumb tendon replacement  . Breast surgery      lt lumpectomy x 2  . Cholecystectomy      FAMILY HISTORY Family History  Problem Relation Age of Onset  . Cancer Father     brain tumor  . Cancer Maternal Aunt     breast  . Cancer Paternal Grandmother     colon  . Heart disease Paternal Grandfather    The patient's father died at the age of 45 from a primary central nervous system tumor. The patient's mother died at age 26. Ilya had no brothers. She has one sister. The only other cancers in the family were her father's mother, who died from colon cancer at the age of 78 and one maternal aunt who had breast cancer diagnosed at age 51 and bladder cancer at age 72.  GYNECOLOGIC HISTORY: Menarche age 16, first live birth age 56, she is [place] P2. She went through menopause approximately 2001. She took hormone replacement for less than 2 years.  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 Her husband, Nicolette Bang" L. Fuerstenberg, is a retired Electronics engineer. Daughter Letishia Elliott lives in [place] and works as Dealer. Of Development for [employer]. She has 2 sons. Daughter Marialice Newkirk lives in [place] and works as a  claims supervisor for [employer]. The patient is a Tourist information centre manager.   ADVANCED DIRECTIVES: in place  HEALTH MAINTENANCE: History  Substance Use Topics  . Smoking status: Never Smoker   . Smokeless tobacco: Never Used  . Alcohol Use: No     Colonoscopy:  PAP: July 2013  Bone density:April of 2008, was normal  Lipid panel: July of 2013 showed a total cholesterol of 227 triglycerides 110 HDL 94 LDL 111  Allergies  Allergen  Reactions  . Sulfa Antibiotics Itching and Rash    Current Outpatient Prescriptions  Medication Sig Dispense Refill  . Biotin 1000 MCG tablet Take 1,000 mcg by mouth daily.      . cholecalciferol (VITAMIN D) 1000 UNITS tablet Take 1,000 Units by mouth daily.      . diclofenac (VOLTAREN) 75 MG EC tablet Take 75 mg by mouth 2 (two) times daily as needed.      Marland Kitchen esomeprazole (NEXIUM) 40 MG capsule Take 40 mg by mouth 3 (three) times a week.      . folic acid (FOLVITE) 340 MCG tablet Take 400 mcg by mouth daily.      . hydrOXYzine (ATARAX/VISTARIL) 25 MG tablet Take 25 mg by mouth daily.      . tamoxifen (NOLVADEX) 20 MG tablet TAKE 1 TABLET EVERY DAY  90 tablet  4  . vitamin B-12 (CYANOCOBALAMIN) 100 MCG tablet Take 100 mcg by mouth daily.       No current facility-administered medications for this visit.    OBJECTIVE:middle-aged white woman in no acute distress Filed Vitals:   11/07/13 1200  BP: 148/87  Pulse: 84  Temp: 98.5 F (36.9 C)  Resp: 18     Body mass index is 28.03 kg/(m^2).    ECOG FS: 0 Filed Weights   11/07/13 1200  Weight: 163 lb 6.4 oz (74.118 kg)   Sclerae unicteric, pupils round and reactive to light Oropharynx clear and moist No cervical or supraclavicular adenopathy Lungs clear to auscultation bilaterally with good excursion, Heart regular rate and rhythm, no murmur appreciated Abd soft, nontender, positive bowel sounds  MSK no focal spinal tenderness,No upper extremity lymphedema  Neuro: nonfocal, well oriented, positive affect Breasts: left breast is status post lumpectomy and radiation. There is no evidence of local recurrence.  Right breast is unremarkable. Axillae are benign bilaterally   LAB RESULTS: Lab Results  Component Value Date   WBC 4.8 10/31/2013   NEUTROABS 2.6 10/31/2013   HGB 14.0 10/31/2013   HCT 42.8 10/31/2013   MCV 84.8 10/31/2013   PLT 174 10/31/2013      Chemistry      Component Value Date/Time   NA 140 10/31/2013 1256   NA 138  03/25/2012 1604   K 3.7 10/31/2013 1256   K 3.7 03/25/2012 1604   CL 108* 09/20/2012 1048   CL 104 03/25/2012 1604   CO2 20* 10/31/2013 1256   CO2 25 03/25/2012 1604   BUN 11.4 10/31/2013 1256   BUN 9 03/25/2012 1604   CREATININE 0.9 10/31/2013 1256   CREATININE 0.70 03/25/2012 1604      Component Value Date/Time   CALCIUM 8.8 10/31/2013 1256   CALCIUM 9.0 03/25/2012 1604   ALKPHOS 73 10/31/2013 1256   ALKPHOS 58 03/25/2012 1604   AST 11 10/31/2013 1256   AST 15 03/25/2012 1604   ALT 9 10/31/2013 1256   ALT 12 03/25/2012 1604   BILITOT 0.28 10/31/2013 1256   BILITOT 0.2* 03/25/2012 1604  Lab Results  Component Value Date   LABCA2 13 04/18/2013    STUDIES:  Mammography at [facility] 10/27/2013 was unremarkable   ASSESSMENT: 63 y.o. woman recently moved from [place] to [place], [place]  (1)   status post left lumpectomy and sentinel lymph node biopsy April of 2012 for a pT1 pN0, stage IA invasive ductal carcinoma, grade 1, estrogen receptor positive, progesterone receptor and HER-2 negative,   (2)  status post adjuvant radiation   (3)  briefly on letrozole with very poor tolerance, on tamoxifen since May of 2012 with good tolerance.   PLAN:  She is doing fine from a breast cancer point of view and has a good prognosis.  We discussed aromatase inhibitors again. Of course we know that if she took 2 years of an aromatase inhibitor starting now she would have a slightly lower risk of recurrence and if she simply takes tamoxifen for 5 years. In the ATAC trial this risk reduction was about 3%, but given her very good prognosis overall I think in her case it would be more than 1%. Of course this would be purchased at increased concerns regarding osteoporosis. In addition she did not tolerate letrozole previously.  Given all that I am comfortable continuing on tamoxifen and given her very good prognosis I do not think we will recommend that she go on to 10 years. Possibly we will send  a PAM50 if that test is already straightened out by the time she gets to 5 years but is not to decide today I would say we will simply stop tamoxifen at 5 years and release her to her primary care physician's. Billy is very much in agreement with this plan  I have no explanation for her right pretibial discomfort, which is being evaluated. I suggested she change her walking shoes for a week or 2 and see if that solves a problem.  Ruhi knows to call for any problems that may develop before her next visit here.  Sharde Gover C    11/07/2013

## 2013-11-07 NOTE — Telephone Encounter (Signed)
, °

## 2013-12-15 ENCOUNTER — Ambulatory Visit (INDEPENDENT_AMBULATORY_CARE_PROVIDER_SITE_OTHER): Payer: BC Managed Care – PPO | Admitting: Podiatry

## 2013-12-15 ENCOUNTER — Ambulatory Visit (INDEPENDENT_AMBULATORY_CARE_PROVIDER_SITE_OTHER): Payer: BC Managed Care – PPO

## 2013-12-15 ENCOUNTER — Encounter: Payer: Self-pay | Admitting: Podiatry

## 2013-12-15 VITALS — BP 114/78 | HR 96 | Resp 16 | Ht 64.0 in | Wt 157.0 lb

## 2013-12-15 DIAGNOSIS — M79609 Pain in unspecified limb: Secondary | ICD-10-CM

## 2013-12-15 DIAGNOSIS — M79673 Pain in unspecified foot: Secondary | ICD-10-CM

## 2013-12-15 DIAGNOSIS — L02619 Cutaneous abscess of unspecified foot: Secondary | ICD-10-CM

## 2013-12-15 DIAGNOSIS — L03039 Cellulitis of unspecified toe: Secondary | ICD-10-CM

## 2013-12-15 DIAGNOSIS — S90129A Contusion of unspecified lesser toe(s) without damage to nail, initial encounter: Secondary | ICD-10-CM

## 2013-12-15 NOTE — Progress Notes (Signed)
   Subjective:    Patient ID: Kayla Spencer, female    DOB: 01-22-1951, 63 y.o.   MRN: [id number]  HPI Comments: i jammed my big toe on my right foot Monday a week ago on a dresser. My toe does not hurt. The nail did bleed on both sides and its loose. It was painful but its gotten better. i put some neosporin on it and soaked it in water.     Review of Systems  Eyes: Positive for itching.  All other systems reviewed and are negative.      Objective:   Physical Exam: I reviewed her past medical history medications allergies surgeries social history and review of systems. Pulses are strongly palpable. Neurologic sensorium is intact per since once the monofilament. Cutaneous evaluation demonstrates loose nail secondary to trauma hallux right with mild odor in purulence from beneath the nail. Radiographic evaluation does not demonstrate any type of trauma to the nail other than a contusion.        Assessment & Plan:  Assessment: Traumatic nail injury paronychia hallux right. Hallux contusion right.  Plan: Discussed etiology pathology conservative versus surgical therapies at this point after local anesthetic was administered nail avulsion was performed I will followup with her in a couple of weeks. She'll start soaking in Epsom salts in warm water starting tomorrow.

## 2013-12-15 NOTE — Patient Instructions (Signed)

## 2013-12-22 ENCOUNTER — Encounter: Payer: Self-pay | Admitting: Podiatry

## 2013-12-22 ENCOUNTER — Ambulatory Visit (INDEPENDENT_AMBULATORY_CARE_PROVIDER_SITE_OTHER): Payer: BC Managed Care – PPO | Admitting: Podiatry

## 2013-12-22 VITALS — BP 112/78 | HR 92 | Resp 16

## 2013-12-22 DIAGNOSIS — L02619 Cutaneous abscess of unspecified foot: Secondary | ICD-10-CM

## 2013-12-22 DIAGNOSIS — B353 Tinea pedis: Secondary | ICD-10-CM

## 2013-12-22 DIAGNOSIS — L03039 Cellulitis of unspecified toe: Principal | ICD-10-CM

## 2013-12-22 DIAGNOSIS — M79609 Pain in unspecified limb: Secondary | ICD-10-CM

## 2013-12-22 NOTE — Progress Notes (Signed)
She presents today for followup of her hallux nail avulsion right. She's been soaking in Betadine and water and appears to be healing quite nicely there is no signs of infection in this area.  Objective: Right foot does demonstrate signs of tinea pedis she says. No erythema edema cellulitis drainage or odor to the hallux left.  Assessment: Tinea pedis right foot resolving well-healing nail avulsion hallux.  Plan: Discontinue Betadine start with Epsom salts warm water soaks covered in the abductor night I gave her samples of Naftin cream for the tinea pedis.

## 2014-04-03 DIAGNOSIS — K529 Noninfective gastroenteritis and colitis, unspecified: Secondary | ICD-10-CM | POA: Insufficient documentation

## 2014-06-05 ENCOUNTER — Encounter: Payer: Self-pay | Admitting: Podiatry

## 2014-06-07 IMAGING — XA DG CHOLANGIOGRAM OPERATIVE
1 series · 1 of 1 positions shown · non-contrast
Comparison: none

REASON FOR EXAM: Cholelithiasis
COMMENTS:

[Series 6001: 1 · 1 of 1 slices shown]
[im 1/1]
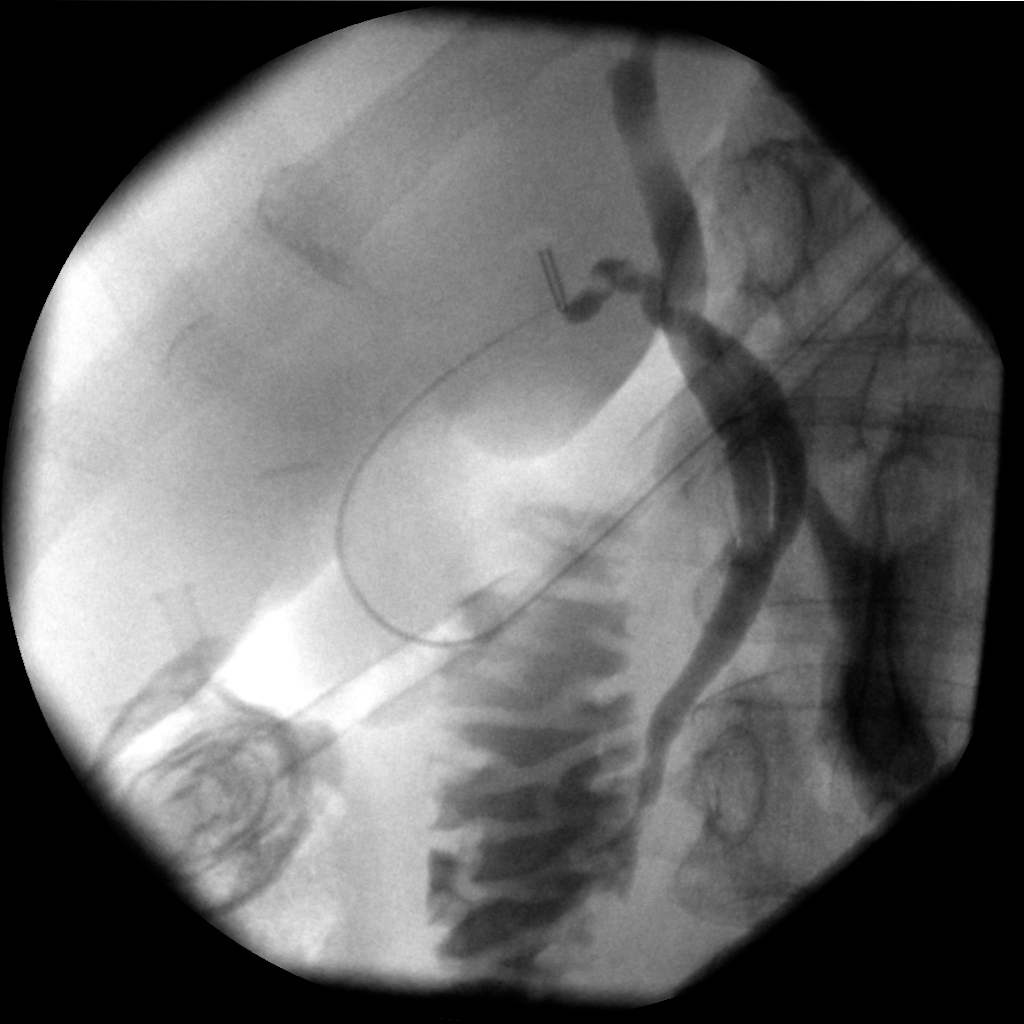

[1 of 1 positions shown; findings below may reference images not displayed]

PROCEDURE:     DXR - DXR CHOLANGIOGRAM OP (INITIAL)  - May 10, 2013  [DATE]

RESULT:     Findings: 1 intraoperative fluoroscopic spot images are obtained
of an intraoperative cholangiogram. by Dr. Tiger without a radiologist
present. Total fluoroscopy time 12 seconds.

There is opacification of the common bile duct without a filling defect.
There is contrast seen within the duodenum.
IMPRESSION: Please see above.

[REDACTED]

## 2014-06-12 ENCOUNTER — Ambulatory Visit (INDEPENDENT_AMBULATORY_CARE_PROVIDER_SITE_OTHER): Payer: BC Managed Care – PPO | Admitting: Obstetrics & Gynecology

## 2014-06-12 ENCOUNTER — Encounter: Payer: Self-pay | Admitting: Obstetrics & Gynecology

## 2014-06-12 VITALS — BP 145/85 | HR 82 | Resp 16 | Ht 64.0 in | Wt 145.0 lb

## 2014-06-12 DIAGNOSIS — N898 Other specified noninflammatory disorders of vagina: Secondary | ICD-10-CM

## 2014-06-12 DIAGNOSIS — N941 Dyspareunia: Secondary | ICD-10-CM | POA: Diagnosis not present

## 2014-06-12 DIAGNOSIS — R6882 Decreased libido: Secondary | ICD-10-CM | POA: Diagnosis not present

## 2014-06-12 DIAGNOSIS — Z01419 Encounter for gynecological examination (general) (routine) without abnormal findings: Secondary | ICD-10-CM

## 2014-06-12 MED ORDER — NYSTATIN 100000 UNIT/GM EX POWD: 0.5000 g | Freq: Two times a day (BID) | CUTANEOUS | Status: DC

## 2014-06-12 MED ORDER — LIDOCAINE 5 % EX OINT: 1.0000 "application " | TOPICAL_OINTMENT | CUTANEOUS | Status: DC | PRN

## 2014-06-13 ENCOUNTER — Telehealth: Payer: Self-pay | Admitting: *Deleted

## 2014-06-13 DIAGNOSIS — B3731 Acute candidiasis of vulva and vagina: Secondary | ICD-10-CM

## 2014-06-13 DIAGNOSIS — B373 Candidiasis of vulva and vagina: Secondary | ICD-10-CM

## 2014-06-13 LAB — WET PREP BY MOLECULAR PROBE
Candida species: POSITIVE — AB
Gardnerella vaginalis: NEGATIVE
Trichomonas vaginosis: NEGATIVE

## 2014-06-13 MED ORDER — FLUCONAZOLE 150 MG PO TABS: 150.0000 mg | ORAL_TABLET | Freq: Once | ORAL | Status: DC

## 2014-06-13 NOTE — Progress Notes (Signed)
  Subjective:    Kayla Spencer is a 63 y.o. female who presents for an annual exam. The patient has complaints of decreased libido.  She feels their life is in a routine and needs some spontaneity.  She also c/o dyspareunia.  . The patient is sexually active. GYN screening history: last pap: was normal. The patient wears seatbelts: yes. The patient participates in regular exercise: yes. Has the patient ever been transfused or tattooed?: not asked. The patient reports that there is not domestic violence in her life.  Pt is on Tamoxifen and will continue until 2017.  She has not had any vaginal bleeding.   Menstrual History: OB History    Gravida Para Term Preterm AB TAB SAB Ectopic Multiple Living   2 2 2       2      No LMP recorded. Patient is postmenopausal.    The following portions of the patient's history were reviewed and updated as appropriate: allergies, current medications, past family history, past medical history, past social history, past surgical history and problem list.  Review of Systems Pertinent items are noted in HPI.    Objective:      Filed Vitals:   06/12/14 1351  BP: 145/85  Pulse: 82  Resp: 16  Height: 5\' 4"  (1.626 m)  Weight: 145 lb (65.772 kg)   Vitals:  WNL General appearance: alert, cooperative and no distress Head: Normocephalic, without obvious abnormality, atraumatic Eyes: negative Throat: lips, mucosa, and tongue normal; teeth and gums normal Lungs: clear to auscultation bilaterally Breasts: normal appearance, no masses or tenderness, No nipple retraction or dimpling, No nipple discharge or bleeding Heart: regular rate and rhythm Abdomen: soft, non-tender; bowel sounds normal; no masses,  no organomegaly  Pelvic:  External Genitalia:  Tanner V, no lesion Urethra;  No prolpase Vagina:  Pink, normal rugae, no blood or discharge; Vagina has good length and width.  Mild discomfort at introitus to touch. Cervix:  No CMT, no lesion Uterus:  Normal  size and contour, non tender Adnexa:  Normal, no masses, non tender  Extremities: no edema, redness or tenderness in the calves or thighs Skin: no lesions or rash Lymph nodes: Axillary adenopathy: none    Assessment:    Healthy female exam.   Dyspareunia Tamoxifen   Plan:    1. Monitor for signs of vaginal bleeding on tamoxifen 2. Pap smear not due for 4 more years. 3. Continue seeing oncology for history of breast cancer. 4.  Set aside date time with husband. 5.  Lidocaine ointment to introitus for dyspareunia

## 2014-06-13 NOTE — Telephone Encounter (Signed)
Called pt to adv positive yeast infection and meds sent to pharm. LMOM for pt to rtn call if needed

## 2014-09-22 NOTE — Telephone Encounter (Signed)
none

## 2014-10-30 ENCOUNTER — Other Ambulatory Visit (HOSPITAL_BASED_OUTPATIENT_CLINIC_OR_DEPARTMENT_OTHER): Payer: BLUE CROSS/BLUE SHIELD

## 2014-10-30 DIAGNOSIS — IMO0001 Reserved for inherently not codable concepts without codable children: Secondary | ICD-10-CM

## 2014-10-30 DIAGNOSIS — C50912 Malignant neoplasm of unspecified site of left female breast: Secondary | ICD-10-CM | POA: Diagnosis not present

## 2014-10-30 DIAGNOSIS — C50919 Malignant neoplasm of unspecified site of unspecified female breast: Secondary | ICD-10-CM

## 2014-10-30 LAB — CBC & DIFF AND RETIC
BASO%: 0.2 % (ref 0.0–2.0)
Basophils Absolute: 0 10*3/uL (ref 0.0–0.1)
EOS%: 1.3 % (ref 0.0–7.0)
Eosinophils Absolute: 0.1 10*3/uL (ref 0.0–0.5)
HCT: 43.1 % (ref 34.8–46.6)
HGB: 14.5 g/dL (ref 11.6–15.9)
Immature Retic Fract: 10.8 % — ABNORMAL HIGH (ref 1.60–10.00)
LYMPH%: 36.3 % (ref 14.0–49.7)
MCH: 28.3 pg (ref 25.1–34.0)
MCHC: 33.6 g/dL (ref 31.5–36.0)
MCV: 84 fL (ref 79.5–101.0)
MONO#: 0.5 10*3/uL (ref 0.1–0.9)
MONO%: 5.7 % (ref 0.0–14.0)
NEUT#: 4.9 10*3/uL (ref 1.5–6.5)
NEUT%: 56.5 % (ref 38.4–76.8)
PLATELETS: 182 10*3/uL (ref 145–400)
RBC: 5.13 10*6/uL (ref 3.70–5.45)
RDW: 14.1 % (ref 11.2–14.5)
RETIC %: 2.08 % (ref 0.70–2.10)
Retic Ct Abs: 106.7 10*3/uL — ABNORMAL HIGH (ref 33.70–90.70)
WBC: 8.6 10*3/uL (ref 3.9–10.3)
lymph#: 3.1 10*3/uL (ref 0.9–3.3)

## 2014-10-30 LAB — COMPREHENSIVE METABOLIC PANEL (CC13)
ALT: 13 U/L (ref 0–55)
AST: 15 U/L (ref 5–34)
Albumin: 3.4 g/dL — ABNORMAL LOW (ref 3.5–5.0)
Alkaline Phosphatase: 74 U/L (ref 40–150)
Anion Gap: 12 meq/L — ABNORMAL HIGH (ref 3–11)
BUN: 12.5 mg/dL (ref 7.0–26.0)
CO2: 22 meq/L (ref 22–29)
Calcium: 8.7 mg/dL (ref 8.4–10.4)
Chloride: 107 meq/L (ref 98–109)
Creatinine: 1 mg/dL (ref 0.6–1.1)
EGFR: 63 ml/min/1.73 m2 — ABNORMAL LOW
Glucose: 92 mg/dL (ref 70–140)
Potassium: 3.8 meq/L (ref 3.5–5.1)
Sodium: 141 meq/L (ref 136–145)
Total Bilirubin: 0.38 mg/dL (ref 0.20–1.20)
Total Protein: 6.6 g/dL (ref 6.4–8.3)

## 2014-11-06 DIAGNOSIS — K219 Gastro-esophageal reflux disease without esophagitis: Secondary | ICD-10-CM | POA: Insufficient documentation

## 2014-11-06 DIAGNOSIS — I10 Essential (primary) hypertension: Secondary | ICD-10-CM | POA: Insufficient documentation

## 2014-11-06 DIAGNOSIS — E785 Hyperlipidemia, unspecified: Secondary | ICD-10-CM | POA: Insufficient documentation

## 2014-11-07 ENCOUNTER — Ambulatory Visit (HOSPITAL_BASED_OUTPATIENT_CLINIC_OR_DEPARTMENT_OTHER): Payer: BLUE CROSS/BLUE SHIELD | Admitting: Nurse Practitioner

## 2014-11-07 ENCOUNTER — Telehealth: Payer: Self-pay | Admitting: Oncology

## 2014-11-07 ENCOUNTER — Encounter: Payer: Self-pay | Admitting: Nurse Practitioner

## 2014-11-07 VITALS — BP 155/94 | HR 90 | Temp 98.0°F | Resp 18 | Ht 64.0 in | Wt 157.4 lb

## 2014-11-07 DIAGNOSIS — C50912 Malignant neoplasm of unspecified site of left female breast: Secondary | ICD-10-CM | POA: Diagnosis not present

## 2014-11-07 NOTE — Addendum Note (Signed)
Addended by: Marcelino Duster on: 11/07/2014 05:12 PM   Modules accepted: Orders

## 2014-11-07 NOTE — Telephone Encounter (Signed)
per pof to sch pt appt-cld [facility] to make pt appt-books not opened yet per Derenda Fennel pt to call and make appt-gave pt copy of sch

## 2014-11-07 NOTE — Progress Notes (Signed)
ID: Kayla Spencer   DOB: 01-Jun-1951  MR#: [id number]  QXI#:[id number]  PCP: Juluis Pitch GYN: Silas [place] SU: Bobette Mo   CHIEF COMPLAINT:  Left breast cancer CURRENT TREATMENT: tamoxifen daily  BREAST CANCER HISTORY: Kayla Spencer Had routine screening mammography in February 2012 followed by ultrasonography confirming a suspicious abnormality in the left breast.. Needle guided biopsy was obtained and showed an invasive ductal carcinoma. 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  On 11/07/2010 the patient underwent left lumpectomy with sentinel lymph node sampling. The final pathology ([id number] at [facility] in [place]) showed an invasive ductal carcinoma, grade 1, measuring 7 mm, with ample margins and 0 of 3 sentinel lymph nodes for disease spread. The tumor was estrogen receptor positive at 95%, progesterone receptor negative, HER-2 negative at 1+ on the HercepTest.The patient received adjuvant radiation, then was started on letrozole, with poor tolerance(diarrhea, nausea, dizziness, and arthralgias). Tamoxifen was started may 2012, and she has tolerated this well.  INTERVAL HISTORY: Kayla Spencer returns today for follow up of her breast cancer. She has been on tamoxifen since May 2012 and is tolerating this drug well. She denies hot flashes, but did have 2 yeast infections last year. She lacks libido, but this is not new for her. The interval history is generally unremarkable. She walks regularly for exercise.   REVIEW OF SYSTEMS: Kayla Spencer denies fevers, chills, nausea, vomiting, or changes in bowel of bladder habits. She has a history of diarrhea, secondary to a cholecystectomy, and uses prevalite powder to reduce the number of loose stools she has in a day. She believes this makes her eat less, and certainly in smaller meals, because everything seems to "run right through her." she  denies shortness of breath, chest pain, cough, or palpitations. A detailed review of systems is otherwise stable.  PAST MEDICAL HISTORY: Past Medical History  Diagnosis Date  . Breast cancer   . Hyperlipidemia   . Osteoarthritis     thumbs  . GERD (gastroesophageal reflux disease)   . Chronic sinusitis     PAST SURGICAL HISTORY: Past Surgical History  Procedure Laterality Date  . Tonsillectomy and adenoidectomy    . Bunionectomy    . Finger arthroplasty      thumb tendon replacement  . Breast surgery      lt lumpectomy x 2  . Cholecystectomy      FAMILY HISTORY Family History  Problem Relation Age of Onset  . Cancer Father     brain tumor  . Cancer Maternal Aunt     breast  . Cancer Paternal Grandmother     colon  . Heart disease Paternal Grandfather    The patient's father died at the age of 31 from a primary central nervous system tumor. The patient's mother died at age 15. Kayla Spencer had no brothers. She has one sister. The only other cancers in the family were her father's mother, who died from colon cancer at the age of 63 and one maternal aunt who had breast cancer diagnosed at age 61 and bladder cancer at age 29.  GYNECOLOGIC HISTORY: Menarche age 55, first live birth age 39, she is Kayla Spencer P2. She went through menopause approximately 2001. She took hormone replacement for less than 2 years.  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 Her husband, Kayla Spencer, is a retired Electronics engineer. Daughter Kayla Spencer lives in [place]  and works as Dealer. Of Development for [employer]. She has 2 sons. Daughter Kayla Spencer lives in [place] and works as a Insurance underwriter for [employer]. The patient is a Tourist information centre manager.   ADVANCED DIRECTIVES: in place  HEALTH MAINTENANCE: History  Substance Use Topics  . Smoking status: Never Smoker   . Smokeless tobacco: Never Used  . Alcohol Use: Yes     Colonoscopy:  PAP: July 2013  Bone  density:April of 2008, was normal  Lipid panel: July of 2013 showed a total cholesterol of 227 triglycerides 110 HDL 94 LDL 111  Allergies  Allergen Reactions  . Sulfa Antibiotics Itching and Rash    Current Outpatient Prescriptions  Medication Sig Dispense Refill  . amLODipine (NORVASC) 5 MG tablet Take 5 mg by mouth daily.    . Biotin 1000 MCG tablet Take 1,000 mcg by mouth daily.    . cholecalciferol (VITAMIN D) 1000 UNITS tablet Take 1,000 Units by mouth daily.    . cholestyramine light (PREVALITE) 4 G packet Take by mouth.    . esomeprazole (NEXIUM) 40 MG capsule Take 40 mg by mouth 3 (three) times a week.    . folic acid (FOLVITE) 725 MCG tablet Take 400 mcg by mouth daily.    . hydrOXYzine (ATARAX/VISTARIL) 25 MG tablet Take 25 mg by mouth daily.    . Lactobacillus-Inulin ([place]) CAPS Take 1 capsule by mouth daily.    . tamoxifen (NOLVADEX) 20 MG tablet Take 1 tablet (20 mg total) by mouth daily. 90 tablet 4  . triamcinolone cream (KENALOG) 0.5 % Apply topically.    . vitamin B-12 (CYANOCOBALAMIN) 100 MCG tablet Take 100 mcg by mouth daily.    Marland Kitchen AFLURIA PRESERVATIVE FREE 0.5 ML SUSY   0  . nystatin (MYCOSTATIN/NYSTOP) 100000 UNIT/GM POWD Apply 0.5 g topically 2 (two) times daily. (Patient not taking: Reported on 11/07/2014) 30 g 0   No current facility-administered medications for this visit.    OBJECTIVE:middle-aged white woman in no acute distress Filed Vitals:   11/07/14 0942  BP: 155/94  Pulse: 90  Temp: 98 F (36.7 C)  Resp: 18     Body mass index is 27 kg/(m^2).    ECOG FS: 0 Filed Weights   11/07/14 0942  Weight: 157 lb 6.4 oz (71.396 kg)   Skin: warm, dry  HEENT: sclerae anicteric, conjunctivae pink, oropharynx clear. No thrush or mucositis.  Lymph Nodes: No cervical or supraclavicular lymphadenopathy  Lungs: clear to auscultation bilaterally, no rales, wheezes, or rhonci  Heart: regular rate and rhythm  Abdomen: round, soft, non tender,  positive bowel sounds  Musculoskeletal: No focal spinal tenderness, no peripheral edema  Neuro: non focal, well oriented, positive affect  Breasts: left breast status post lumpectomy and radiation. No evidence of recurrent disease. Left axilla benign. Right breast unremarkable.  LAB RESULTS: Lab Results  Component Value Date   WBC 8.6 10/30/2014   NEUTROABS 4.9 10/30/2014   HGB 14.5 10/30/2014   HCT 43.1 10/30/2014   MCV 84.0 10/30/2014   PLT 182 10/30/2014      Chemistry      Component Value Date/Time   NA 141 10/30/2014 1001   NA 138 03/25/2012 1604   K 3.8 10/30/2014 1001   K 3.7 03/25/2012 1604   CL 108* 09/20/2012 1048   CL 104 03/25/2012 1604   CO2 22 10/30/2014 1001   CO2 25 03/25/2012 1604   BUN 12.5 10/30/2014 1001   BUN 9 03/25/2012  1604   CREATININE 1.0 10/30/2014 1001   CREATININE 0.70 03/25/2012 1604      Component Value Date/Time   CALCIUM 8.7 10/30/2014 1001   CALCIUM 9.0 03/25/2012 1604   ALKPHOS 74 10/30/2014 1001   ALKPHOS 58 03/25/2012 1604   AST 15 10/30/2014 1001   AST 15 03/25/2012 1604   ALT 13 10/30/2014 1001   ALT 12 03/25/2012 1604   BILITOT 0.38 10/30/2014 1001   BILITOT 0.2* 03/25/2012 1604       Lab Results  Component Value Date   LABCA2 13 04/18/2013    STUDIES:  Most recent mammogram at [facility] on 10/30/2014 was unremarkable  ASSESSMENT: 64 y.o. woman recently moved from [place] to [place], [place]  (1)   status post left lumpectomy and sentinel lymph node biopsy April of 2012 for a pT1 pN0, stage IA invasive ductal carcinoma, grade 1, estrogen receptor positive, progesterone receptor and HER-2 negative,   (2)  status post adjuvant radiation   (3)  briefly on letrozole with very poor tolerance, on tamoxifen since May of 2012 with good tolerance.   PLAN:  Maddisyn continues to do well as far as her breast cancer is concerned. She is now 4 years out from her definitive surgery, with no evidence of recurrent  disease. She is tolerating the tamoxifen well, and will continue this drug until May 2017 to complete 5 years of antiestrogen therapy.   Diamantina will return in 1 year for labs and a follow up visit. At this time she will be eligible to "graduate" from oncology visits, and be followed by her PCP or gynecologist alone. She understands and agrees with this plan. She knows the goal of treatment in her case is cure. She has been encouraged to call with any issues that might arise before her next visit here.    Genelle Gather Kyndall Chaplin    11/07/2014

## 2014-11-08 ENCOUNTER — Other Ambulatory Visit: Payer: Self-pay | Admitting: Oncology

## 2014-11-24 NOTE — Op Note (Signed)
PATIENT NAME:  Kayla Spencer, Kayla Spencer MR#:  [id number] DATE OF BIRTH:  12-12-1950  DATE OF PROCEDURE:  05/10/2013  PREOPERATIVE DIAGNOSIS: Chronic cholecystitis.   POSTOPERATIVE DIAGNOSIS:  Chronic cholecystitis.  PROCEDURE: Laparoscopic cholecystectomy, cholangiogram.   SURGEON: Loreli Dollar, MD  ANESTHESIA: General.   INDICATIONS: This 64 year old female has a history of severe chest pain and also right upper quadrant pain, fatty food intolerance, borderline low gallbladder ejection fraction of 36%. Surgery was recommended for definitive treatment.   DESCRIPTION OF PROCEDURE: The patient was placed on the operating table in the supine position under general anesthesia. The abdomen was prepared with ChloraPrep and draped in a sterile manner.   A short incision was made in the inferior aspect of the umbilicus and carried down to the deep fascia, which was grasped with laryngeal hook and elevated. A Veress needle was inserted, aspirated and irrigated with a saline solution. Next, the peritoneal cavity was inflated with carbon dioxide. The Veress needle was removed. The 10 mm cannula was inserted. The 10 mm, 0- degree laparoscope was inserted to view the peritoneal cavity. The liver appeared normal. The gallbladder was identified. Another incision was made in the epigastrium just slightly to the right of the midline to introduce an 11 mm cannula. Two incisions were made in the lateral aspect of the right upper quadrant to introduce two 5 mm cannulas.   The gallbladder was retracted towards the right shoulder. The infundibulum was retracted inferiorly and laterally. The porta hepatis was demonstrated. The gallbladder was mobilized with incision of the visceral peritoneum. The cystic duct was dissected free from surrounding structures. The cystic artery was dissected free from surrounding structures. A critical view of safety was demonstrated. An Endo Clip was placed across the cystic duct adjacent to the  neck of the gallbladder. An incision was made in the cystic duct to introduce a Reddick catheter. Half-strength Conray-60 dye was injected as the cholangiogram was done with fluoroscopy viewing the biliary tree. There was a long cystic duct. There was prompt flow of dye into the duodenum. No retained stones were seen. The Reddick catheter was removed. The cystic duct was doubly ligated with Endoclips and divided. The cystic artery was controlled with double Endoclips and divided. The gallbladder was dissected free from the liver with hook and cautery. Bleeding was very scant. Hemostasis was subsequently intact. The gallbladder was delivered up through the infraumbilical incision, opened, suctioned, removed, and submitted in formalin for routine pathology. The right upper quadrant was further inspected. Hemostasis was intact. The cannulas were removed. Carbon dioxide was allowed to escape from the peritoneal cavity. Skin incisions were closed with interrupted 5-0 chromic subcuticular suture, benzoin and Steri-Strips. Dressings were applied with paper tape. The patient tolerated surgery satisfactorily, and was prepared for transfer to the recovery room.      ____________________________ Lenna Sciara. Rochel Brome, MD jws:mr D: 05/10/2013 19:07:26 ET T: 05/10/2013 19:34:42 ET JOB#: [id number]  cc: Loreli Dollar, MD, <Dictator> Loreli Dollar MD ELECTRONICALLY SIGNED 05/11/2013 18:19

## 2015-03-05 ENCOUNTER — Ambulatory Visit (INDEPENDENT_AMBULATORY_CARE_PROVIDER_SITE_OTHER): Payer: BLUE CROSS/BLUE SHIELD | Admitting: Podiatry

## 2015-03-05 VITALS — BP 129/98 | HR 85 | Resp 16

## 2015-03-05 DIAGNOSIS — L603 Nail dystrophy: Secondary | ICD-10-CM

## 2015-03-05 DIAGNOSIS — L600 Ingrowing nail: Secondary | ICD-10-CM

## 2015-03-05 DIAGNOSIS — M79673 Pain in unspecified foot: Secondary | ICD-10-CM

## 2015-03-05 MED ORDER — NEOMYCIN-POLYMYXIN-HC 1 % OT SOLN: OTIC | Status: DC

## 2015-03-05 NOTE — Patient Instructions (Signed)

## 2015-03-05 NOTE — Progress Notes (Signed)
She presents today with chief complaint of ingrown toenail to the hallux right medial border. States that it grew back after we performed the procedure last year.  Objective: Vital signs are stable alert and oriented 3. Sharp rated now marked along the tibial border of the hallux right. Pulses are strongly palpable there is mild granulation tissue. Once.  Assessment: Paronychia ingrown nail hallux right.  Plan: Chemical matrixectomy was performed today into the tibial border of the hallux right after local anesthesia was administered. She tolerated the procedure well as the nail was split from distal to proximal avulsed in total and 3 applications of phenol were applied. It was neutralized with isopropyl alcohol Silvadene cream Teflon pad and dry sterile compressive dressing was applied. She will start soaking twice daily in Epson salts or Betadine and warm water and apply Cortisporin Otic as directed I will follow-up with her in 1 week.

## 2015-03-12 ENCOUNTER — Ambulatory Visit (INDEPENDENT_AMBULATORY_CARE_PROVIDER_SITE_OTHER): Payer: BLUE CROSS/BLUE SHIELD | Admitting: Podiatry

## 2015-03-12 DIAGNOSIS — L600 Ingrowing nail: Secondary | ICD-10-CM

## 2015-03-12 NOTE — Patient Instructions (Signed)

## 2015-03-12 NOTE — Progress Notes (Signed)
She presents today for follow-up of her hallux nail matrixectomy right foot. She denies fevers chills nausea vomiting muscle aches and pains.  Objective: Vital signs are stable she's alert and oriented 3. I see no signs of infection. Appears to be healing very well.  Assessment: Well-healing matrixectomy hallux.  Plan: Discontinue Betadine soap Epsom salts and warm water soaks covered in the daily living at night. Continue second to completely well follow up with me as needed.

## 2015-03-26 ENCOUNTER — Encounter: Payer: Self-pay | Admitting: Podiatry

## 2015-03-26 ENCOUNTER — Ambulatory Visit (INDEPENDENT_AMBULATORY_CARE_PROVIDER_SITE_OTHER): Payer: BLUE CROSS/BLUE SHIELD | Admitting: Podiatry

## 2015-03-26 DIAGNOSIS — L603 Nail dystrophy: Secondary | ICD-10-CM

## 2015-03-26 DIAGNOSIS — Z79899 Other long term (current) drug therapy: Secondary | ICD-10-CM

## 2015-03-26 MED ORDER — TERBINAFINE HCL 250 MG PO TABS: 250.0000 mg | ORAL_TABLET | Freq: Every day | ORAL | Status: DC

## 2015-03-26 NOTE — Progress Notes (Signed)
She presents today for her lab results. She's denies any changes in her toenails as of yet.  Objective: Vital signs are stable she is alert and oriented 3. No change in her nail plates. [place] labs demonstrates onychomycosis.  Assessment: Dermatophytic infection of the toenails.  Plan: Start her on Lamisil 250 mg tablets 1 by mouth daily and a liver profile was requested. I will follow-up with her in 1 month at which time another liver profile will be performed and a prescription for 90 more pills will be prescribed.

## 2015-03-26 NOTE — Patient Instructions (Signed)

## 2015-03-27 LAB — CBC WITH DIFFERENTIAL/PLATELET
BASOS: 0 %
Basophils Absolute: 0 10*3/uL (ref 0.0–0.2)
EOS (ABSOLUTE): 0.1 10*3/uL (ref 0.0–0.4)
EOS: 1 %
HEMATOCRIT: 43 % (ref 34.0–46.6)
HEMOGLOBIN: 14.9 g/dL (ref 11.1–15.9)
Immature Grans (Abs): 0.1 10*3/uL (ref 0.0–0.1)
Immature Granulocytes: 1 %
LYMPHS ABS: 3.8 10*3/uL — AB (ref 0.7–3.1)
Lymphs: 36 %
MCH: 28.8 pg (ref 26.6–33.0)
MCHC: 34.7 g/dL (ref 31.5–35.7)
MCV: 83 fL (ref 79–97)
MONOCYTES: 6 %
MONOS ABS: 0.6 10*3/uL (ref 0.1–0.9)
Neutrophils Absolute: 6 10*3/uL (ref 1.4–7.0)
Neutrophils: 56 %
Platelets: 230 10*3/uL (ref 150–379)
RBC: 5.18 x10E6/uL (ref 3.77–5.28)
RDW: 14.6 % (ref 12.3–15.4)
WBC: 10.6 10*3/uL (ref 3.4–10.8)

## 2015-03-28 LAB — HEPATIC FUNCTION PANEL
ALBUMIN: 4.4 g/dL (ref 3.6–4.8)
ALK PHOS: 78 IU/L (ref 39–117)
ALT: 14 IU/L (ref 0–32)
AST: 16 IU/L (ref 0–40)
Bilirubin Total: 0.4 mg/dL (ref 0.0–1.2)
Bilirubin, Direct: 0.13 mg/dL (ref 0.00–0.40)
Total Protein: 7.1 g/dL (ref 6.0–8.5)

## 2015-04-25 ENCOUNTER — Ambulatory Visit (INDEPENDENT_AMBULATORY_CARE_PROVIDER_SITE_OTHER): Payer: Medicare Other | Admitting: Podiatry

## 2015-04-25 ENCOUNTER — Encounter: Payer: Self-pay | Admitting: Podiatry

## 2015-04-25 VITALS — BP 132/97 | HR 105 | Resp 16

## 2015-04-25 DIAGNOSIS — Z79899 Other long term (current) drug therapy: Secondary | ICD-10-CM | POA: Diagnosis not present

## 2015-04-25 DIAGNOSIS — L603 Nail dystrophy: Secondary | ICD-10-CM | POA: Diagnosis not present

## 2015-04-25 LAB — CBC WITH DIFFERENTIAL/PLATELET
BASOS: 0 %
Basophils Absolute: 0 10*3/uL (ref 0.0–0.2)
EOS (ABSOLUTE): 0.1 10*3/uL (ref 0.0–0.4)
EOS: 1 %
HEMATOCRIT: 43.2 % (ref 34.0–46.6)
HEMOGLOBIN: 15.3 g/dL (ref 11.1–15.9)
IMMATURE GRANULOCYTES: 0 %
Immature Grans (Abs): 0 10*3/uL (ref 0.0–0.1)
Lymphocytes Absolute: 3.4 10*3/uL — ABNORMAL HIGH (ref 0.7–3.1)
Lymphs: 34 %
MCH: 29.3 pg (ref 26.6–33.0)
MCHC: 35.4 g/dL (ref 31.5–35.7)
MCV: 83 fL (ref 79–97)
MONOCYTES: 6 %
Monocytes Absolute: 0.6 10*3/uL (ref 0.1–0.9)
NEUTROS PCT: 59 %
Neutrophils Absolute: 5.9 10*3/uL (ref 1.4–7.0)
Platelets: 221 10*3/uL (ref 150–379)
RBC: 5.23 x10E6/uL (ref 3.77–5.28)
RDW: 14.3 % (ref 12.3–15.4)
WBC: 10 10*3/uL (ref 3.4–10.8)

## 2015-04-25 MED ORDER — TERBINAFINE HCL 250 MG PO TABS: 250.0000 mg | ORAL_TABLET | Freq: Every day | ORAL | Status: DC

## 2015-04-25 NOTE — Progress Notes (Signed)
She presents today for her first 30 days of Lamisil. She states that she denied any fever or chills nausea vomiting [place] pains rashes or itching. She tolerated the medication well. She states that he may have loosened her stools slightly.  Objective: Vital signs are stable alert and oriented 3 no change in her nail plates as of yet.  Assessment: Onychomycosis long-term treatment with Lamisil.  Plan: Request another liver profile and CBC. Wrote another prescription for Lamisil 250 mg tablets #90 follow-up with her 4 months. She will call sooner with questions or concerns.

## 2015-04-26 ENCOUNTER — Telehealth: Payer: Self-pay | Admitting: *Deleted

## 2015-04-26 LAB — HEPATIC FUNCTION PANEL
ALT: 12 IU/L (ref 0–32)
AST: 15 IU/L (ref 0–40)
Albumin: 4.3 g/dL (ref 3.6–4.8)
Alkaline Phosphatase: 72 IU/L (ref 39–117)
Bilirubin Total: 0.2 mg/dL (ref 0.0–1.2)
Bilirubin, Direct: 0.07 mg/dL (ref 0.00–0.40)
Total Protein: 6.9 g/dL (ref 6.0–8.5)

## 2015-04-26 NOTE — Telephone Encounter (Addendum)
-----   Message from Garrel Ridgel, [place] sent at 04/26/2015  7:48 AM EDT ----- Blood work looks perfect and may continue medication. Levels are essentially unchanged.  Left message informing pt of Dr. Stephenie Acres orders.

## 2015-07-16 ENCOUNTER — Encounter: Payer: Self-pay | Admitting: Obstetrics & Gynecology

## 2015-07-16 ENCOUNTER — Ambulatory Visit (INDEPENDENT_AMBULATORY_CARE_PROVIDER_SITE_OTHER): Payer: Medicare Other | Admitting: Obstetrics & Gynecology

## 2015-07-16 VITALS — BP 131/88 | HR 94 | Resp 16 | Ht 64.0 in | Wt 163.0 lb

## 2015-07-16 DIAGNOSIS — Z01419 Encounter for gynecological examination (general) (routine) without abnormal findings: Secondary | ICD-10-CM

## 2015-07-16 DIAGNOSIS — Z79810 Long term (current) use of selective estrogen receptor modulators (SERMs): Secondary | ICD-10-CM

## 2015-07-16 DIAGNOSIS — B372 Candidiasis of skin and nail: Secondary | ICD-10-CM

## 2015-07-16 MED ORDER — NYSTATIN 100000 UNIT/GM EX POWD: CUTANEOUS | Status: DC

## 2015-07-16 MED ORDER — CLOTRIMAZOLE-BETAMETHASONE 1-0.05 % EX CREA: TOPICAL_CREAM | CUTANEOUS | Status: DC

## 2015-07-16 NOTE — Progress Notes (Signed)
  Subjective:    Kayla Spencer is a 64 y.o. female who presents for an annual exam. The patient has no complaints today. She is nearing her 5 year anniversary of breast cancer survivor.  She is currently taking tamoxifen and has experienced no postmenopausal bleeding. The patient is sexually active. GYN screening history: last pap: approximate date 2014 and was normal. The patient wears seatbelts: yes. Pt has some burning where her skin fold is just above mons pubis.  Whe has used nystatin powder that has helped but not resolved.  Menstrual History: OB History    Gravida Para Term Preterm AB TAB SAB Ectopic Multiple Living   2 2 2       2       No LMP recorded. Patient is postmenopausal.    The following portions of the patient's history were reviewed and updated as appropriate: allergies, current medications, past family history, past medical history, past social history, past surgical history and problem list.  Review of Systems Pertinent items noted in HPI and remainder of comprehensive ROS otherwise negative.    Objective:      Filed Vitals:   07/16/15 1351  BP: 131/88  Pulse: 94  Resp: 16  Height: 5\' 4"  (1.626 m)  Weight: 163 lb (73.936 kg)   Vitals:  WNL General appearance: alert, cooperative and no distress  HEENT: Normocephalic, without obvious abnormality, atraumatic Eyes: negative Throat: lips, mucosa, and tongue normal; teeth and gums normal  Respiratory: Clear to auscultation bilaterally  CV: Regular rate and rhythm  Breasts:  Normal appearance, no masses or tenderness, no nipple retraction or dimpling scars on left breast well-healed   GI: Soft, non-tender; bowel sounds normal; no masses,  no organomegaly  GU: External Genitalia:  Tanner V, no lesion Urethra:  No prolapse   Vagina: Pale pink, normal rugae, no blood or discharge  Cervix: No CMT, no lesion  Uterus:  Normal size and contour, non tender  Adnexa: Normal, no masses, non tender  Musculoskeletal: No  edema, redness or tenderness in the calves or thighs  Skin:  evidence of cutaneous yeast and abdominal skin fold just above mons pubis   Lymphatic: Axillary adenopathy: none    Psychiatric: Normal mood and behavior      .    Assessment:    Healthy female exam.   Cutaneous yeast   Plan:    Patient will have 1 last Pap smear before she reaches age 66. We discussed cancer guidelines. Patient is to report any postmenopausal bleeding Will prescribe Lotrisone before cutaneous yeast of lower abdominal wall. Continue nystatin powder after finishing course of Lotrisone. Breast cancer followed by Dr. [place]ble [place] not Colonoscopy up-to-date

## 2015-08-24 ENCOUNTER — Telehealth: Payer: Self-pay | Admitting: *Deleted

## 2015-08-24 NOTE — Telephone Encounter (Signed)
Pt states she has an appt 08/27/2015, but was only able to tolerate 2 months of the 4 months medication and was wondering if she needed to keep the appt. I told pt she should keep the appt, because Dr. Milinda Pointer would be able to discuss alternatives.  Pt agreed.

## 2015-08-27 ENCOUNTER — Encounter: Payer: Self-pay | Admitting: Podiatry

## 2015-08-27 ENCOUNTER — Ambulatory Visit (INDEPENDENT_AMBULATORY_CARE_PROVIDER_SITE_OTHER): Payer: Medicare Other | Admitting: Podiatry

## 2015-08-27 VITALS — BP 120/87 | HR 102 | Resp 12

## 2015-08-27 DIAGNOSIS — Z79899 Other long term (current) drug therapy: Secondary | ICD-10-CM | POA: Diagnosis not present

## 2015-08-27 NOTE — Progress Notes (Signed)
She presents today concerned that her toenails do not appear to be completely grown out as she refers to the onychomycosis. When I asked her if she completed her medication she states the know she did not. She discontinued the Lamisil after her 60 days. She did not complete the 120 day dose. She was not having issues but was concerned that there may be problems.  Objective: Vital signs are stable she is alert and oriented 3 nail appears to be clearing from proximal to distal she refers to the hallux right. I see no signs of athlete's foot. The nail is resolving.  Assessment: Well-healing onychomycosis.  Plan: I recommended that she continue the Lamisil she states that she's not sure that she has it but she does not want me to order it. She will notify us if she wants to continue to take it.

## 2015-09-03 ENCOUNTER — Other Ambulatory Visit: Payer: Self-pay | Admitting: Obstetrics & Gynecology

## 2015-09-03 DIAGNOSIS — B372 Candidiasis of skin and nail: Secondary | ICD-10-CM

## 2015-09-05 NOTE — Telephone Encounter (Signed)
-----   Message from Guss Bunde, MD sent at 09/04/2015  1:55 PM EST ----- Pt requested refill on mycostatin.  Can you see her response to the initial tube and nystatin powder.  She might need to go see a dermatologist if not getting better.

## 2015-09-05 NOTE — Telephone Encounter (Signed)
Called pt to adv refill approved and if no better will need to see dermatologist - [facility]

## 2015-11-01 ENCOUNTER — Other Ambulatory Visit (HOSPITAL_BASED_OUTPATIENT_CLINIC_OR_DEPARTMENT_OTHER): Payer: Medicare Other

## 2015-11-01 DIAGNOSIS — C50912 Malignant neoplasm of unspecified site of left female breast: Secondary | ICD-10-CM | POA: Diagnosis not present

## 2015-11-01 LAB — COMPREHENSIVE METABOLIC PANEL
ANION GAP: 7 meq/L (ref 3–11)
AST: 12 U/L (ref 5–34)
Albumin: 3.5 g/dL (ref 3.5–5.0)
Alkaline Phosphatase: 71 U/L (ref 40–150)
BUN: 19.9 mg/dL (ref 7.0–26.0)
CALCIUM: 9.2 mg/dL (ref 8.4–10.4)
CO2: 22 meq/L (ref 22–29)
CREATININE: 1.1 mg/dL (ref 0.6–1.1)
Chloride: 111 mEq/L — ABNORMAL HIGH (ref 98–109)
EGFR: 52 mL/min/{1.73_m2} — ABNORMAL LOW (ref >90)
Glucose: 93 mg/dl (ref 70–140)
Potassium: 3.9 mEq/L (ref 3.5–5.1)
Sodium: 141 mEq/L (ref 136–145)
TOTAL PROTEIN: 7 g/dL (ref 6.4–8.3)

## 2015-11-01 LAB — CBC WITH DIFFERENTIAL/PLATELET
BASO%: 0.5 % (ref 0.0–2.0)
Basophils Absolute: 0 10*3/uL (ref 0.0–0.1)
EOS ABS: 0.1 10*3/uL (ref 0.0–0.5)
EOS%: 1.5 % (ref 0.0–7.0)
HEMATOCRIT: 43.6 % (ref 34.8–46.6)
HGB: 14 g/dL (ref 11.6–15.9)
LYMPH%: 38.7 % (ref 14.0–49.7)
MCH: 27.3 pg (ref 25.1–34.0)
MCHC: 32.2 g/dL (ref 31.5–36.0)
MCV: 84.9 fL (ref 79.5–101.0)
MONO#: 0.3 10*3/uL (ref 0.1–0.9)
MONO%: 4.7 % (ref 0.0–14.0)
NEUT%: 54.6 % (ref 38.4–76.8)
NEUTROS ABS: 3.8 10*3/uL (ref 1.5–6.5)
PLATELETS: 204 10*3/uL (ref 145–400)
RBC: 5.13 10*6/uL (ref 3.70–5.45)
RDW: 15.4 % — ABNORMAL HIGH (ref 11.2–14.5)
WBC: 6.9 10*3/uL (ref 3.9–10.3)
lymph#: 2.7 10*3/uL (ref 0.9–3.3)

## 2015-11-08 ENCOUNTER — Telehealth: Payer: Self-pay | Admitting: Oncology

## 2015-11-08 ENCOUNTER — Ambulatory Visit (HOSPITAL_BASED_OUTPATIENT_CLINIC_OR_DEPARTMENT_OTHER): Payer: Medicare Other | Admitting: Oncology

## 2015-11-08 VITALS — BP 153/90 | HR 86 | Temp 97.8°F | Resp 18 | Ht 64.0 in | Wt 158.7 lb

## 2015-11-08 DIAGNOSIS — C50912 Malignant neoplasm of unspecified site of left female breast: Secondary | ICD-10-CM

## 2015-11-08 MED ORDER — TAMOXIFEN CITRATE 20 MG PO TABS: ORAL_TABLET | ORAL | Status: DC

## 2015-11-08 NOTE — Progress Notes (Signed)
ID: Kayla Spencer   DOB: 1951/06/18  MR#: [id number]  TGG#:[id number]  PCP: Juluis Pitch GYN: Silas [place] SU: Bobette Mo   CHIEF COMPLAINT:  Left breast cancer  CURRENT TREATMENT: tamoxifen daily  BREAST CANCER HISTORY: Kayla Spencer Had routine screening mammography in February 2012 followed by ultrasonography confirming a suspicious abnormality in the left breast.. Needle guided biopsy was obtained and showed an invasive ductal carcinoma. 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  On 11/07/2010 the patient underwent left lumpectomy with sentinel lymph node sampling. The final pathology ([id number] at [facility] in [place]) showed an invasive ductal carcinoma, grade 1, measuring 7 mm, with ample margins and 0 of 3 sentinel lymph nodes for disease spread. The tumor was estrogen receptor positive at 95%, progesterone receptor negative, HER-2 negative at 1+ on the HercepTest.The patient received adjuvant radiation, then was started on letrozole, with poor tolerance(diarrhea, nausea, dizziness, and arthralgias). Tamoxifen was started may 2012, and she has tolerated this well.  INTERVAL HISTORY: Kayla Spencer returns today for follow up of her estrogen receptor positive breast cancer. She continues on tamoxifen, with good tolerance. Hot flashes and vaginal drainage are not a major issue. She obtains a drug at no cost.  REVIEW OF SYSTEMS: Kayla Spencer exercises chiefly by walking. She doesn't [employer] a local gym but seldom uses it. She is having a little bit of loose bowel movements a since her gallbladder was removed and recently was started on colestipol which constipated her. She has lowered the dose and now things are much more acceptable. Otherwise a detailed review of systems today was entirely negative  PAST MEDICAL HISTORY: Past Medical History  Diagnosis Date  . Breast cancer ([place])   .  Hyperlipidemia   . Osteoarthritis     thumbs  . GERD (gastroesophageal reflux disease)   . Chronic sinusitis     PAST SURGICAL HISTORY: Past Surgical History  Procedure Laterality Date  . Tonsillectomy and adenoidectomy    . Bunionectomy    . Finger arthroplasty      thumb tendon replacement  . Breast surgery      lt lumpectomy x 2  . Cholecystectomy      FAMILY HISTORY Family History  Problem Relation Age of Onset  . Cancer Father     brain tumor  . Cancer Maternal Aunt     breast  . Cancer Paternal Grandmother     colon  . Heart disease Paternal Grandfather    The patient's father died at the age of 69 from a primary central nervous system tumor. The patient's mother died at age 71. Kayla Spencer had no brothers. She has one sister. The only other cancers in the family were her father's mother, who died from colon cancer at the age of 71 and one maternal aunt who had breast cancer diagnosed at age 39 and bladder cancer at age 32.  GYNECOLOGIC HISTORY: Menarche age 53, first live birth age 60, she is Cobb P2. She went through menopause approximately 2001. She took hormone replacement for less than 2 years.  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 Her husband, Kayla Spencer, is a retired Electronics engineer. Daughter Madasyn Heath lives in [place] and works as Dealer. Of Development for [employer]. She has 2 sons. Daughter Arya Luttrull lives in [place] and works as a Insurance underwriter for [employer]. The patient is  a [employer].   ADVANCED DIRECTIVES: in place  HEALTH MAINTENANCE: Social History  Substance Use Topics  . Smoking status: Never Smoker   . Smokeless tobacco: Never Used  . Alcohol Use: Yes     Colonoscopy:  PAP: July 2013  Bone density:April of 2008, was normal  Lipid panel: per PCP  Allergies  Allergen Reactions  . Simvastatin Other (See Comments)  . Sulfa Antibiotics Itching and Rash    Current Outpatient  Prescriptions  Medication Sig Dispense Refill  . amLODipine (NORVASC) 5 MG tablet Take 5 mg by mouth daily.    . Biotin 1000 MCG tablet Take 1,000 mcg by mouth daily.    . cholecalciferol (VITAMIN D) 1000 UNITS tablet Take 1,000 Units by mouth daily.    . clotrimazole-betamethasone (LOTRISONE) cream Apply to affected area twice a day for 1 week then once a day for a week. 30 g 0  . esomeprazole (NEXIUM) 40 MG capsule Take 40 mg by mouth 3 (three) times a week.    . hydrOXYzine (ATARAX/VISTARIL) 25 MG tablet Take 25 mg by mouth daily.    . Lactobacillus-Inulin ([place]) CAPS Take 1 capsule by mouth daily.    Marland Kitchen nystatin (MYCOSTATIN/NYSTOP) 100000 UNIT/GM POWD USE AS DIRECTED 60 g 0  . tamoxifen (NOLVADEX) 20 MG tablet TAKE 1 TABLET (20 MG TOTAL) BY MOUTH DAILY. 90 tablet 3  . vitamin B-12 (CYANOCOBALAMIN) 100 MCG tablet Take 100 mcg by mouth daily.     No current facility-administered medications for this visit.    OBJECTIVE:middle-aged white woman Who appears well Filed Vitals:   11/08/15 1254  BP: 153/90  Pulse: 86  Temp: 97.8 F (36.6 C)  Resp: 18     Body mass index is 27.23 kg/(m^2).    ECOG FS: 0 Filed Weights   11/08/15 1254  Weight: 158 lb 11.2 oz (71.986 kg)   Sclerae unicteric, pupils round and equal Oropharynx clear and moist-- no thrush or other lesions No cervical or supraclavicular adenopathy Lungs no rales or rhonchi Heart regular rate and rhythm Abd soft, nontender, positive bowel sounds MSK no focal spinal tenderness, no upper extremity lymphedema Neuro: nonfocal, well oriented, appropriate affect Breasts: The right breast is unremarkable. The left breast status post lumpectomy and radiation. There is no evidence of local recurrence. The left axilla is benign.  LAB RESULTS: Lab Results  Component Value Date   WBC 6.9 11/01/2015   NEUTROABS 3.8 11/01/2015   HGB 14.0 11/01/2015   HCT 43.6 11/01/2015   MCV 84.9 11/01/2015   PLT 204  11/01/2015      Chemistry      Component Value Date/Time   NA 141 11/01/2015 0934   NA 143 04/04/2013 0810   NA 138 03/25/2012 1604   K 3.9 11/01/2015 0934   K 3.4* 04/04/2013 0810   K 3.7 03/25/2012 1604   CL 111* 04/04/2013 0810   CL 108* 09/20/2012 1048   CL 104 03/25/2012 1604   CO2 22 11/01/2015 0934   CO2 24 04/04/2013 0810   CO2 25 03/25/2012 1604   BUN 19.9 11/01/2015 0934   BUN 9 04/04/2013 0810   BUN 9 03/25/2012 1604   CREATININE 1.1 11/01/2015 0934   CREATININE 0.79 04/04/2013 0810   CREATININE 0.70 03/25/2012 1604      Component Value Date/Time   CALCIUM 9.2 11/01/2015 0934   CALCIUM 8.4* 04/04/2013 0810   CALCIUM 9.0 03/25/2012 1604   ALKPHOS 71 11/01/2015 0934   ALKPHOS 72 04/25/2015  1016   ALKPHOS 74 04/04/2013 0810   AST 12 11/01/2015 0934   AST 15 04/25/2015 1016   AST 40* 04/04/2013 0810   ALT <9 11/01/2015 0934   ALT 12 04/25/2015 1016   ALT 32 04/04/2013 0810   BILITOT <0.30 11/01/2015 0934   BILITOT 0.2 04/25/2015 1016   BILITOT 0.3 04/04/2013 0810   BILITOT 0.2* 03/25/2012 1604       Lab Results  Component Value Date   LABCA2 13 04/18/2013    STUDIES:  Most recent mammogram at [facility] on 11/01/2015 was benign; breast density is category B  ASSESSMENT: 65 y.o. [place], [place] woman  (1)   status post left lumpectomy and sentinel lymph node biopsy April of 2012 for a pT1 pN0, stage IA invasive ductal carcinoma, grade 1, estrogen receptor positive, progesterone receptor and HER-2 negative,   (2)  status post adjuvant radiation   (3)  briefly on letrozole with very poor tolerance, on tamoxifen since May of 2012 with good tolerance.   PLAN:  [place] is now 5 years out from her definitive surgery with no evidence of disease recurrence. This is very favorable.  She will complete 5 years of antiestrogen in May. We discussed "graduating" from follow-up. We also discussed the possibility of continuing tamoxifen for an  additional 5 years.  She is tolerating the tamoxifen without any significant side effects. She obtains it at no cost. If she does continue it for an additional 5 years she would have a risk reduction in the 2-3% range. This is motivating for her.  Accordingly we will continue to see her on a yearly basis for the next 5 years and she will continue on tamoxifen for that period of time.  I encouraged her to intensify her exercise program and [facility]  She knows to call for any problems that may develop before her next visit here. Lasharon Dunivan C    11/08/2015

## 2015-11-08 NOTE — Telephone Encounter (Signed)
appt made and avs printed. Mammo to be sch for 2018

## 2016-10-06 ENCOUNTER — Ambulatory Visit (INDEPENDENT_AMBULATORY_CARE_PROVIDER_SITE_OTHER): Payer: Medicare Other | Admitting: Obstetrics & Gynecology

## 2016-10-06 ENCOUNTER — Encounter: Payer: Self-pay | Admitting: Obstetrics & Gynecology

## 2016-10-06 VITALS — BP 136/85 | HR 89 | Ht 64.0 in | Wt 159.0 lb

## 2016-10-06 DIAGNOSIS — Z79810 Long term (current) use of selective estrogen receptor modulators (SERMs): Secondary | ICD-10-CM

## 2016-10-06 DIAGNOSIS — N959 Unspecified menopausal and perimenopausal disorder: Secondary | ICD-10-CM | POA: Diagnosis not present

## 2016-10-06 NOTE — Progress Notes (Signed)
Subjective:    Patient ID: Kayla Spencer, female    DOB: [birth year], 66 y.o.   MRN: [id number]  HPI  Patient is 66 year old female who presents for surveillance of being on tamoxifen. Patient has not had any vaginal bleeding. Patient is taking etra year of tamoxifen in conjunction with counseling from her oncologist, Dr. Gunnar Bulla macro not.  Patient will discuss whether to continue the tamoxifen at her next oncology visit.  Review of Systems  Constitutional: Negative.   Respiratory: Negative.   Gastrointestinal: Negative.   Genitourinary: Negative.   Psychiatric/Behavioral: Negative.        Objective:   Physical Exam  Constitutional: She is oriented to person, place, and time. She appears well-developed and well-nourished. No distress.  HENT:  Head: Normocephalic and atraumatic.  Eyes: Conjunctivae are normal.  Pulmonary/Chest: Effort normal.  Abdominal: Soft. Bowel sounds are normal. She exhibits no distension and no mass. There is no tenderness. There is no rebound and no guarding.  Genitourinary:  Genitourinary Comments: Tanner 5 Vulva: Small 2 mm gray blue lesion consistent with vessel on left labia majora.  It is palpable and has not changed in size or consistency for several years. Vagina: Pale pink, mild atrophic changes.   Cervix: No lesion no CMT Uterus nontender and mobile Adnexa no masses nontender  Musculoskeletal: She exhibits no edema.  Neurological: She is alert and oriented to person, place, and time.  Skin: Skin is warm and dry.  Psychiatric: She has a normal mood and affect.  Vitals reviewed. Breasts: No masses nontender no lymphadenopathy  Vitals:   10/06/16 1446  BP: 136/85  Pulse: 89  Weight: 159 lb (72.1 kg)  Height: 5\' 4"  (1.626 m)       Assessment & Plan:  Nml postmenopausal exam Pt understands risks of endometrial cancer with tamoxifen.  She will discuss with Dr. Griffith Citron about how long to continue.   Postmenopausal women-In postmenopausal  women, tamoxifen exerts an estrogenic effect that can stimulate endometrial proliferation. Tamoxifen appears to promote (1) cell proliferation through MAP kinase pathways, c-MYC, and insulin-like growth factor 1 (IGF1) pathways, (2) cell proliferation and invasion through alterations in estrogen receptor-alpha and the membrane-associated estrogen receptor G protein-coupled receptor 30 (GPR30), (3) cell migration through ERK and Src signaling, (4) DNA damage through DNA adduct formation, and (5) upregulation of the pro-survival unfolded protein response (UPR) pathway. However, the identification of all tamoxifen targets in the endometrium is likely incomplete and the effects of long-term tamoxifen exposure on molecular changes remain unclear [11].  Endometrial stimulation by tamoxifen in postmenopausal women can lead to endometrial polyps, hyperplasia, or carcinoma. (See 'Risks and management of uterine pathology' below.)  Screening-For women on tamoxifen therapy, routine screening for EC is not performed.  The benefits of risks and burdens of screening for EC has not been found to outweigh the risks and burdens in average risk women. Likewise, no studies have demonstrated a benefit in women on tamoxifen [55,56]. As an example, in one study, 304 asymptomatic women on tamoxifen for breast cancer adjuvant therapy underwent yearly TVUS for six years [55]. Using an endometrial thickness of >9 mm, the sensitivity and specificity for EC were low: 63.3 percent and 60.4 percent, respectively. ACOG recommends against screening asymptomatic women on tamoxifen for EC  Duration of therapy-The risk of EC increases with increased duration of tamoxifen therapy. This is an important clinical issue, particularly because extending tamoxifen treatment from 5 to 10 years has become an option in breast cancer therapy. (See "  Adjuvant endocrine therapy for non-metastatic, hormone receptor-positive breast cancer", section on  'Tamoxifen'.) Studies that evaluated the impact of duration of tamoxifen therapy on EC risk include:  ?In the Adjuvant Tamoxifen: Longer Against Shorter (ATLAS) trial (n = 12,894), the risk of EC was significantly higher at 10 years compared with 5 years (RR 1.74, 95% CI 1.30-2.34) [43]. Absolute cumulative risk of EC during years 5 to 14 of the ATLAS study was 3.1 percent compared with 1.6 percent in those who stopped after 5 years of tamoxifen.  ?A case-control study compared women with breast cancer (n = 299) who developed EC after diagnosis with controls found that the odds increased 2.3-fold with ?2 years of tamoxifen and 6.6-fold with ?6 years [44].

## 2016-11-07 ENCOUNTER — Ambulatory Visit: Payer: Medicare Other | Admitting: Nurse Practitioner

## 2016-11-07 ENCOUNTER — Ambulatory Visit (HOSPITAL_BASED_OUTPATIENT_CLINIC_OR_DEPARTMENT_OTHER): Payer: Medicare Other | Admitting: Adult Health

## 2016-11-07 ENCOUNTER — Encounter: Payer: Medicare Other | Admitting: Adult Health

## 2016-11-07 ENCOUNTER — Other Ambulatory Visit (HOSPITAL_BASED_OUTPATIENT_CLINIC_OR_DEPARTMENT_OTHER): Payer: Medicare Other

## 2016-11-07 ENCOUNTER — Telehealth: Payer: Self-pay | Admitting: Adult Health

## 2016-11-07 ENCOUNTER — Encounter: Payer: Self-pay | Admitting: Adult Health

## 2016-11-07 ENCOUNTER — Other Ambulatory Visit: Payer: Medicare Other

## 2016-11-07 VITALS — BP 131/78 | HR 90 | Temp 98.1°F | Resp 18 | Ht 64.0 in | Wt 159.6 lb

## 2016-11-07 DIAGNOSIS — C50912 Malignant neoplasm of unspecified site of left female breast: Secondary | ICD-10-CM

## 2016-11-07 DIAGNOSIS — Z170 Estrogen receptor positive status [ER+]: Secondary | ICD-10-CM | POA: Diagnosis not present

## 2016-11-07 LAB — COMPREHENSIVE METABOLIC PANEL
ALBUMIN: 3.4 g/dL — AB (ref 3.5–5.0)
ALK PHOS: 72 U/L (ref 40–150)
ALT: 11 U/L (ref 0–55)
ANION GAP: 10 meq/L (ref 3–11)
AST: 16 U/L (ref 5–34)
BILIRUBIN TOTAL: 0.4 mg/dL (ref 0.20–1.20)
BUN: 12.1 mg/dL (ref 7.0–26.0)
CALCIUM: 9 mg/dL (ref 8.4–10.4)
CO2: 24 mEq/L (ref 22–29)
Chloride: 105 mEq/L (ref 98–109)
Creatinine: 1 mg/dL (ref 0.6–1.1)
EGFR: 61 mL/min/{1.73_m2} — AB (ref >90)
Glucose: 76 mg/dl (ref 70–140)
Potassium: 3.8 mEq/L (ref 3.5–5.1)
Sodium: 139 mEq/L (ref 136–145)
TOTAL PROTEIN: 6.8 g/dL (ref 6.4–8.3)

## 2016-11-07 LAB — CBC WITH DIFFERENTIAL/PLATELET
BASO%: 0.9 % (ref 0.0–2.0)
Basophils Absolute: 0.1 10*3/uL (ref 0.0–0.1)
EOS ABS: 0.1 10*3/uL (ref 0.0–0.5)
EOS%: 1.3 % (ref 0.0–7.0)
HEMATOCRIT: 44.4 % (ref 34.8–46.6)
HGB: 14.9 g/dL (ref 11.6–15.9)
LYMPH#: 3.6 10*3/uL — AB (ref 0.9–3.3)
LYMPH%: 37.4 % (ref 14.0–49.7)
MCH: 28.4 pg (ref 25.1–34.0)
MCHC: 33.4 g/dL (ref 31.5–36.0)
MCV: 84.9 fL (ref 79.5–101.0)
MONO#: 0.5 10*3/uL (ref 0.1–0.9)
MONO%: 5.1 % (ref 0.0–14.0)
NEUT%: 55.3 % (ref 38.4–76.8)
NEUTROS ABS: 5.3 10*3/uL (ref 1.5–6.5)
PLATELETS: 215 10*3/uL (ref 145–400)
RBC: 5.23 10*6/uL (ref 3.70–5.45)
RDW: 15.6 % — ABNORMAL HIGH (ref 11.2–14.5)
WBC: 9.6 10*3/uL (ref 3.9–10.3)

## 2016-11-07 NOTE — Progress Notes (Signed)
CLINIC:  Survivorship   REASON FOR VISIT:  Routine follow-up for history of breast cancer.   BRIEF ONCOLOGIC HISTORY:   (1)   status post left lumpectomy and sentinel lymph node biopsy April of 2012 for a pT1 pN0, stage IA invasive ductal carcinoma, grade 1, estrogen receptor positive, progesterone receptor and HER-2 negative,   (2)  status post adjuvant radiation   (3)  briefly on letrozole with very poor tolerance, on tamoxifen since May of 2012 with good tolerance.  INTERVAL HISTORY:  Ms. Baade presents to the Survivorship Clinic today for routine follow-up for her history of breast cancer.  Overall, she reports feeling quite well. She continues to take tamoxifen for her breast cancer and is concerned that coming off of it she may have a recurrence.  She understands that some patients will benefit after five years of anti-estrogen therapy.  She has done some research on bci testing and would like this done.  She is doing well otherwise.  She continues to follow up with her gynecologist and PCP.      REVIEW OF SYSTEMS:  Review of Systems  Constitutional: Negative for chills, diaphoresis, fever, malaise/fatigue and weight loss.  Skin: Negative for rash.  Neurological: Negative for weakness.  Breast: Denies any new nodularity, masses, tenderness, nipple changes, or nipple discharge.    PAST MEDICAL/SURGICAL HISTORY:  Past Medical History:  Diagnosis Date  . Breast cancer ([place])   . Chronic sinusitis   . GERD (gastroesophageal reflux disease)   . Hyperlipidemia   . Osteoarthritis    thumbs   Past Surgical History:  Procedure Laterality Date  . BREAST SURGERY     lt lumpectomy x 2  . BUNIONECTOMY    . CHOLECYSTECTOMY    . FINGER ARTHROPLASTY     thumb tendon replacement  . TONSILLECTOMY AND ADENOIDECTOMY       ALLERGIES:  Allergies  Allergen Reactions  . Simvastatin Other (See Comments)  . Sulfa Antibiotics Itching and Rash     CURRENT MEDICATIONS:    Outpatient Encounter Prescriptions as of 11/07/2016  Medication Sig  . amLODipine (NORVASC) 5 MG tablet Take 5 mg by mouth daily.  . Biotin 1000 MCG tablet Take 1,000 mcg by mouth daily.  . cholecalciferol (VITAMIN D) 1000 UNITS tablet Take 1,000 Units by mouth daily.  . colestipol (COLESTID) 1 g tablet Take 1 g by mouth 2 (two) times daily.  . cyanocobalamin 1000 MCG tablet Take 1,000 mcg by mouth daily.  Marland Kitchen esomeprazole (NEXIUM) 40 MG capsule Take 40 mg by mouth 3 (three) times a week.  . hydrOXYzine (ATARAX/VISTARIL) 25 MG tablet Take 25 mg by mouth daily.  . Lactobacillus-Inulin ([place]) CAPS Take 1 capsule by mouth daily.  Marland Kitchen nystatin (MYCOSTATIN/NYSTOP) 100000 UNIT/GM POWD USE AS DIRECTED (Patient not taking: Reported on 10/06/2016)  . tamoxifen (NOLVADEX) 20 MG tablet TAKE 1 TABLET (20 MG TOTAL) BY MOUTH DAILY.   No facility-administered encounter medications on file as of 11/07/2016.      ONCOLOGIC FAMILY HISTORY:  Family History  Problem Relation Age of Onset  . Cancer Father     brain tumor  . Cancer Maternal Aunt     breast  . Cancer Paternal Grandmother     colon  . Heart disease Paternal Grandfather     SOCIAL HISTORY:  Claudia Greenley is married and lives with her husband in [place], [place].    Ms. Reising is currently retired.  She denies any current or history  of tobacco, alcohol, or illicit drug use.     PHYSICAL EXAMINATION:  Vital Signs: Vitals:   11/07/16 1152  BP: 131/78  Pulse: 90  Resp: 18  Temp: 98.1 F (36.7 C)   Filed Weights   11/07/16 1152  Weight: 159 lb 9.6 oz (72.4 kg)   General: Well-nourished, well-appearing female in no acute distress.  Unaccompanied today.   HEENT: Head is normocephalic.  Pupils equal and reactive to light. Conjunctivae clear without exudate.  Sclerae anicteric. Oral mucosa is pink, moist.  Oropharynx is pink without lesions or erythema.  Lymph: No cervical, supraclavicular, or  infraclavicular lymphadenopathy noted on palpation.  Cardiovascular: Regular rate and rhythm.Marland Kitchen Respiratory: Clear to auscultation bilaterally. Chest expansion symmetric; breathing non-labored.  Breast Exam:  -Left breast: No appreciable masses on palpation. No skin redness, thickening, or peau d'orange appearance; no nipple retraction or nipple discharge; mild distortion in symmetry at previous lumpectomy site well healed scar without erythema or nodularity.  -Right breast: No appreciable masses on palpation. No skin redness, thickening, or peau d'orange appearance;-Axilla: No axillary adenopathy bilaterally.  GI: Abdomen soft and round; non-tender, non-distended. Bowel sounds normoactive. No hepatosplenomegaly.   GU: Deferred.  Neuro: No focal deficits. Steady gait.  Psych: Mood and affect normal and appropriate for situation.  Extremities: No edema. Skin: Warm and dry.  LABORATORY DATA:  Appointment on 11/07/2016  Component Date Value Ref Range Status  . WBC 11/07/2016 9.6  3.9 - 10.3 10e3/uL Final  . NEUT# 11/07/2016 5.3  1.5 - 6.5 10e3/uL Final  . HGB 11/07/2016 14.9  11.6 - 15.9 g/dL Final  . HCT 11/07/2016 44.4  34.8 - 46.6 % Final  . Platelets 11/07/2016 215  145 - 400 10e3/uL Final  . MCV 11/07/2016 84.9  79.5 - 101.0 fL Final  . MCH 11/07/2016 28.4  25.1 - 34.0 pg Final  . MCHC 11/07/2016 33.4  31.5 - 36.0 g/dL Final  . RBC 11/07/2016 5.23  3.70 - 5.45 10e6/uL Final  . RDW 11/07/2016 15.6* 11.2 - 14.5 % Final  . lymph# 11/07/2016 3.6* 0.9 - 3.3 10e3/uL Final  . MONO# 11/07/2016 0.5  0.1 - 0.9 10e3/uL Final  . Eosinophils Absolute 11/07/2016 0.1  0.0 - 0.5 10e3/uL Final  . Basophils Absolute 11/07/2016 0.1  0.0 - 0.1 10e3/uL Final  . NEUT% 11/07/2016 55.3  38.4 - 76.8 % Final  . LYMPH% 11/07/2016 37.4  14.0 - 49.7 % Final  . MONO% 11/07/2016 5.1  0.0 - 14.0 % Final  . EOS% 11/07/2016 1.3  0.0 - 7.0 % Final  . BASO% 11/07/2016 0.9  0.0 - 2.0 % Final  . Sodium 11/07/2016 139   136 - 145 mEq/L Final  . Potassium 11/07/2016 3.8  3.5 - 5.1 mEq/L Final  . Chloride 11/07/2016 105  98 - 109 mEq/L Final  . CO2 11/07/2016 24  22 - 29 mEq/L Final  . Glucose 11/07/2016 76  70 - 140 mg/dl Final  . BUN 11/07/2016 12.1  7.0 - 26.0 mg/dL Final  . Creatinine 11/07/2016 1.0  0.6 - 1.1 mg/dL Final  . Total Bilirubin 11/07/2016 0.40  0.20 - 1.20 mg/dL Final  . Alkaline Phosphatase 11/07/2016 72  40 - 150 U/L Final  . AST 11/07/2016 16  5 - 34 U/L Final  . ALT 11/07/2016 11  0 - 55 U/L Final  . Total Protein 11/07/2016 6.8  6.4 - 8.3 g/dL Final  . Albumin 11/07/2016 3.4* 3.5 - 5.0 g/dL Final  . Calcium  11/07/2016 9.0  8.4 - 10.4 mg/dL Final  . Anion Gap 11/07/2016 10  3 - 11 mEq/L Final  . EGFR 11/07/2016 61* >90 ml/min/1.73 m2 Final     DIAGNOSTIC IMAGING:  Most recent mammogram:   Pending receipt from [place]:  Ms.. Toutant is a pleasant 66 y.o. female with history of Stage IA left breast invasive ductal carcinoma, ER+/PR+/HER2-, diagnosed in 2012, treated with lumpectomy, adjuvant radiation therapy, and anti-estrogen therapy with Tamoxifen beginning in 2012.  She presents to the Survivorship Clinic for surveillance and routine follow-up.   1. History of breast cancer:  Ms. Bazar is currently clinically and radiographically without evidence of disease or recurrence of breast cancer. She will be due for mammogram in about one year.  She will continue her anti-estrogen therapy with Tamoxifen.  Due to her concern for risk/benefit, through thorough counseling, we decided to try to order BCI testing.  She understands that we may not be able to perform this test if there is insufficient tissue to test.  For now, she will continue Tamoxifen and we will go from there.  I encouraged her to call me with any questions or concerns before her next visit at the cancer center, and I would be happy to see her sooner, if needed.    2. Cancer screening:  Due to Ms.  Loudin's history and her age, she should receive screening for skin cancers, colon cancer, and gynecologic cancers. She was encouraged to follow-up with her PCP for appropriate cancer screenings.   3. Health maintenance and wellness promotion: Ms. Mcneff was encouraged to consume 5-7 servings of fruits and vegetables per day. She was also encouraged to engage in moderate to vigorous exercise for 30 minutes per day most days of the week. She was instructed to limit her alcohol consumption and continue to abstain from tobacco use/was encouraged stop smoking.     Dispo:  -Return to cancer center in one year for LTS follow up   A total of (30) minutes of face-to-face time was spent with this patient with greater than 50% of that time in counseling and care-coordination.   Gardenia Phlegm, [place] [phone]   Note: PRIMARY CARE PROVIDER Juluis Pitch, [place] [phone]

## 2016-11-07 NOTE — Telephone Encounter (Signed)
Appointments scheduled per 4.6.18 LOS. Patient given AVS report and calendars with future scheduled appointments.  Date and time per patient request/ insurance.

## 2016-11-10 ENCOUNTER — Other Ambulatory Visit: Payer: Self-pay | Admitting: *Deleted

## 2016-11-10 MED ORDER — TAMOXIFEN CITRATE 20 MG PO TABS: ORAL_TABLET | ORAL | 0 refills | Status: DC

## 2016-12-09 ENCOUNTER — Other Ambulatory Visit: Payer: Self-pay

## 2016-12-09 MED ORDER — TAMOXIFEN CITRATE 20 MG PO TABS: ORAL_TABLET | ORAL | 3 refills | Status: DC

## 2017-01-09 ENCOUNTER — Telehealth: Payer: Self-pay | Admitting: *Deleted

## 2017-01-09 NOTE — Telephone Encounter (Signed)
This RN returned call to pt per her inquiry about results per Survivorship visit in April and " biotheranostic test were sent to see if I have benefit to continue the tamoxifen " - pt states she contacted the above office and was informed results are available and faxed to this office approximately 2 weeks ago.  This RN reviewed chart for above reading ( noted in visit's dictation) - results not under lab nor scanned into media.  This RN contacted [employer] at [phone] and spoke with Ander Purpura - requested refax of results to this RN's direct fax number.  This RN let the patient know results were received - and need for NP to review for appropriate recommendations. Results received and given to LC/NP.

## 2017-01-09 NOTE — Telephone Encounter (Signed)
Called and spoke with patient about results and that she is low risk for recurrence, and low benefit from extended endocrine therapy.  I recommended that she discontinue Tamoxifen.  She verbalized understanding.  I have sent her results out to be mailed to her directly.

## 2017-01-12 ENCOUNTER — Telehealth: Payer: Self-pay | Admitting: *Deleted

## 2017-01-12 NOTE — Telephone Encounter (Signed)
Mailed Breast Cancer Index form to pt per Lindsay's request.

## 2017-05-08 ENCOUNTER — Encounter: Payer: Self-pay | Admitting: *Deleted

## 2017-05-11 ENCOUNTER — Other Ambulatory Visit: Payer: Self-pay | Admitting: Gastroenterology

## 2017-05-11 ENCOUNTER — Encounter
Admission: RE | Disposition: A | Discharge status: Home / Self Care | Payer: Self-pay | Source: Ambulatory Visit | Attending: Gastroenterology

## 2017-05-11 ENCOUNTER — Ambulatory Visit
Admission: RE | Admit: 2017-05-11 | Discharge: 2017-05-11 | Disposition: A | Discharge status: Home / Self Care | Payer: Medicare Other | Source: Ambulatory Visit | Attending: Gastroenterology | Admitting: Gastroenterology

## 2017-05-11 ENCOUNTER — Encounter: Payer: Self-pay | Admitting: Anesthesiology

## 2017-05-11 ENCOUNTER — Ambulatory Visit: Payer: Medicare Other | Admitting: Anesthesiology

## 2017-05-11 DIAGNOSIS — Z79899 Other long term (current) drug therapy: Secondary | ICD-10-CM | POA: Diagnosis not present

## 2017-05-11 DIAGNOSIS — Z8719 Personal history of other diseases of the digestive system: Secondary | ICD-10-CM | POA: Insufficient documentation

## 2017-05-11 DIAGNOSIS — K640 First degree hemorrhoids: Secondary | ICD-10-CM | POA: Insufficient documentation

## 2017-05-11 DIAGNOSIS — E785 Hyperlipidemia, unspecified: Secondary | ICD-10-CM | POA: Insufficient documentation

## 2017-05-11 DIAGNOSIS — Z882 Allergy status to sulfonamides status: Secondary | ICD-10-CM | POA: Insufficient documentation

## 2017-05-11 DIAGNOSIS — K644 Residual hemorrhoidal skin tags: Secondary | ICD-10-CM | POA: Insufficient documentation

## 2017-05-11 DIAGNOSIS — Z888 Allergy status to other drugs, medicaments and biological substances status: Secondary | ICD-10-CM | POA: Insufficient documentation

## 2017-05-11 DIAGNOSIS — K6389 Other specified diseases of intestine: Secondary | ICD-10-CM

## 2017-05-11 DIAGNOSIS — K219 Gastro-esophageal reflux disease without esophagitis: Secondary | ICD-10-CM | POA: Insufficient documentation

## 2017-05-11 DIAGNOSIS — Z09 Encounter for follow-up examination after completed treatment for conditions other than malignant neoplasm: Secondary | ICD-10-CM | POA: Diagnosis present

## 2017-05-11 DIAGNOSIS — K635 Polyp of colon: Secondary | ICD-10-CM | POA: Diagnosis not present

## 2017-05-11 DIAGNOSIS — K5669 Other partial intestinal obstruction: Secondary | ICD-10-CM

## 2017-05-11 DIAGNOSIS — C50919 Malignant neoplasm of unspecified site of unspecified female breast: Secondary | ICD-10-CM | POA: Diagnosis not present

## 2017-05-11 HISTORY — DX: Headache, unspecified: R51.9

## 2017-05-11 HISTORY — PX: COLONOSCOPY WITH PROPOFOL: SHX5780

## 2017-05-11 HISTORY — DX: Dyspnea, unspecified: R06.00

## 2017-05-11 HISTORY — DX: Personal history of other diseases of the digestive system: Z87.19

## 2017-05-11 HISTORY — DX: Headache: R51

## 2017-05-11 SURGERY — COLONOSCOPY WITH PROPOFOL
Anesthesia: General

## 2017-05-11 MED ORDER — SODIUM CHLORIDE 0.9 % IV SOLN: INTRAVENOUS | Status: DC

## 2017-05-11 MED ORDER — PROPOFOL 500 MG/50ML IV EMUL
INTRAVENOUS | Status: AC
  Filled 2017-05-11: qty 50

## 2017-05-11 MED ORDER — PROPOFOL 10 MG/ML IV BOLUS
INTRAVENOUS | Status: DC | PRN
  Administered 2017-05-11: 50 mg via INTRAVENOUS

## 2017-05-11 MED ORDER — PROPOFOL 10 MG/ML IV BOLUS
INTRAVENOUS | Status: AC
  Filled 2017-05-11: qty 20

## 2017-05-11 MED ORDER — SODIUM CHLORIDE 0.9 % IV SOLN
INTRAVENOUS | Status: DC
  Administered 2017-05-11: 10:00:00 via INTRAVENOUS
  Administered 2017-05-11: 1000 mL via INTRAVENOUS

## 2017-05-11 MED ORDER — PROPOFOL 500 MG/50ML IV EMUL
INTRAVENOUS | Status: DC | PRN
  Administered 2017-05-11: 50 ug/kg/min via INTRAVENOUS

## 2017-05-11 NOTE — Anesthesia Post-op Follow-up Note (Signed)
Anesthesia QCDR form completed.        

## 2017-05-11 NOTE — Anesthesia Postprocedure Evaluation (Signed)
Anesthesia Post Note  Patient: Conservation officer, historic buildings  Procedure(s) Performed: COLONOSCOPY WITH PROPOFOL (N/A )  Patient location during evaluation: Endoscopy Anesthesia Type: General Level of consciousness: awake and alert Pain management: pain level controlled Vital Signs Assessment: post-procedure vital signs reviewed and stable Respiratory status: spontaneous breathing, nonlabored ventilation, respiratory function stable and patient connected to nasal cannula oxygen Cardiovascular status: blood pressure returned to baseline and stable Postop Assessment: no apparent nausea or vomiting Anesthetic complications: no     Last Vitals:  Vitals:   05/11/17 1029 05/11/17 1039  BP: 117/77 119/80  Pulse: 80 80  Resp: 16 15  Temp:    SpO2: 97% 100%    Last Pain:  Vitals:   05/11/17 0909  TempSrc: Tympanic                 Analiese Krupka S

## 2017-05-11 NOTE — Transfer of Care (Signed)
Immediate Anesthesia Transfer of Care Note  Patient: Kayla Spencer  Procedure(s) Performed: COLONOSCOPY WITH PROPOFOL (N/A )  Patient Location: PACU and Endoscopy Unit  Anesthesia Type:General  Level of Consciousness: awake, alert  and oriented  Airway & Oxygen Therapy: Patient Spontanous Breathing and Patient connected to nasal cannula oxygen  Post-op Assessment: Report given to RN and Post -op Vital signs reviewed and stable  Post vital signs: Reviewed and stable  Last Vitals:  Vitals:   05/11/17 0909  BP: (!) 135/96  Pulse: (!) 110  Resp: 18  Temp: 36.6 C  SpO2: 97%    Last Pain:  Vitals:   05/11/17 0909  TempSrc: Tympanic         Complications: No apparent anesthesia complications

## 2017-05-11 NOTE — Anesthesia Preprocedure Evaluation (Signed)
Anesthesia Evaluation  Patient identified by MRN, date of birth, ID band Patient awake    Reviewed: Allergy & Precautions, NPO status , Patient's Chart, lab work & pertinent test results, reviewed documented beta blocker date and time   Airway Mallampati: II  TM Distance: >3 FB     Dental  (+) Chipped   Pulmonary           Cardiovascular hypertension,      Neuro/Psych  Headaches,    GI/Hepatic hiatal hernia, GERD  ,  Endo/Other    Renal/GU      Musculoskeletal  (+) Arthritis ,   Abdominal   Peds  Hematology   Anesthesia Other Findings   Reproductive/Obstetrics                             Anesthesia Physical Anesthesia Plan  ASA: III  Anesthesia Plan: General   Post-op Pain Management:    Induction: Intravenous  PONV Risk Score and Plan:   Airway Management Planned:   Additional Equipment:   Intra-op Plan:   Post-operative Plan:   Informed Consent: I have reviewed the patients History and Physical, chart, labs and discussed the procedure including the risks, benefits and alternatives for the proposed anesthesia with the patient or authorized representative who has indicated his/her understanding and acceptance.     Plan Discussed with: CRNA  Anesthesia Plan Comments:         Anesthesia Quick Evaluation

## 2017-05-11 NOTE — H&P (Signed)
Outpatient short stay form Pre-procedure 05/11/2017 9:35 AM Kayla Sails MD  Primary Physician: Dr. Juluis Pitch  Reason for visit:  Colonoscopy  History of present illness:  Patient is a 66 year old female presenting today as above. She has a history of having loose stools since a cholecystectomy was done in 2014. She has not yet been trialed on a mild sequestrant resin. She tolerated her prep well. She takes no blood thinning agents or aspirin products.    Current Facility-Administered Medications:  .  0.9 %  sodium chloride infusion, , Intravenous, Continuous, Kayla Sails, MD, Last Rate: 20 mL/hr at 05/11/17 8206 .  0.9 %  sodium chloride infusion, , Intravenous, Continuous, Kayla Sails, MD  Prescriptions Prior to Admission  Medication Sig Dispense Refill Last Dose  . esomeprazole (NEXIUM) 20 MG capsule Take 20 mg by mouth daily at 12 noon.     . [DISCONTINUED] hyoscyamine (LEVSIN SL) 0.125 MG SL tablet Place 0.125 mg under the tongue every 4 (four) hours as needed.     Marland Kitchen amLODipine (NORVASC) 5 MG tablet Take 5 mg by mouth daily.   Taking  . cyanocobalamin 1000 MCG tablet Take 1,000 mcg by mouth daily.   Taking  . hydrOXYzine (ATARAX/VISTARIL) 25 MG tablet Take 25 mg by mouth daily.   Taking  . Lactobacillus-Inulin ([place]) CAPS Take 1 capsule by mouth daily.   Taking  . nystatin (MYCOSTATIN/NYSTOP) 100000 UNIT/GM POWD USE AS DIRECTED 60 g 0 Taking  . [DISCONTINUED] omeprazole (PRILOSEC) 20 MG capsule Take 20 mg by mouth daily.   Taking  . [DISCONTINUED] tamoxifen (NOLVADEX) 20 MG tablet TAKE 1 TABLET (20 MG TOTAL) BY MOUTH DAILY. 90 tablet 3      Allergies  Allergen Reactions  . Simvastatin Other (See Comments)  . Sulfa Antibiotics Itching and Rash     Past Medical History:  Diagnosis Date  . Breast cancer ([place])   . Chronic sinusitis   . Dyspnea   . GERD (gastroesophageal reflux disease)   . Headache    migraines  . History of  hiatal hernia   . Hyperlipidemia   . Osteoarthritis    thumbs    Review of systems:      Physical Exam    Heart and lungs: Regular rate and rhythm without rub or gallop, lungs are bilaterally clear.    HEENT: Normocephalic atraumatic eyes are anicteric    Other:     Pertinant exam for procedure: Soft nontender nondistended bowel sounds positive normoactive.    Planned proceedures: Colonoscopy and indicated procedures. I have discussed the risks benefits and complications of procedures to include not limited to bleeding, infection, perforation and the risk of sedation and the patient wishes to proceed.    Kayla Sails, MD Gastroenterology 05/11/2017  9:35 AM

## 2017-05-11 NOTE — Op Note (Addendum)
[facility]enter[facility] Patient Name: Kayla Spencer Procedure Date: 05/11/2017 9:28 AM MRN: [id number] Account #: [id number] Date of Birth: Oct 21, 1950 Admit Type: Outpatient Age: 66 Room: [facility] ENDO ROOM 1 Gender: Female Note Status: Finalized Procedure:            Colonoscopy Indications:          High risk colon cancer surveillance: Personal history                        of colonic polyps Providers:            Lollie Sails, MD Referring MD:         Youlanda Roys. Lovie Macadamia, MD (Referring MD) Medicines:            Monitored Anesthesia Care Complications:        No immediate complications. Procedure:            Pre-Anesthesia Assessment:                       - ASA Grade Assessment: III - A patient with severe                        systemic disease.                       After obtaining informed consent, the colonoscope was                        passed under direct vision. Throughout the procedure,                        the patient's blood pressure, pulse, and oxygen                        saturations were monitored continuously. The                        Colonoscope was introduced through the anus and                        advanced to the the cecum, identified by appendiceal                        orifice and ileocecal valve. The quality of the bowel                        preparation was good. The colonoscopy was performed                        with moderate difficulty due to significant looping.                        Successful completion of the procedure was aided by                        using manual pressure. Findings:      Four sessile polyps were found in the descending colon. The polyps were       1 to 3 mm in size. These polyps were removed with a cold biopsy forceps.       Resection and retrieval were complete.  Biopsies for histology were taken with a cold forceps from the right       colon and left colon for evaluation of  microscopic colitis.      Appearance of possible extrinsic compression of the base of the cecum.       The appendiceal orifice itself is normal in appearance.      Non-bleeding external and internal hemorrhoids were found during       retroflexion, during perianal exam and during anoscopy. The hemorrhoids       were small and Grade I (internal hemorrhoids that do not prolapse).      The digital rectal exam was normal otherwise. Impression:           - Four 1 to 3 mm polyps in the descending colon,                        removed with a cold biopsy forceps. Resected and                        retrieved.                       - Non-bleeding external and internal hemorrhoids.                       - Biopsies were taken with a cold forceps from the                        right colon and left colon for evaluation of                        microscopic colitis. Recommendation:       - Discharge patient to home.                       - Telephone GI clinic for pathology results in 1 week.                       - trial Questran 2-4 gm po daily for probable                        choleretic diarrhea. Procedure Code(s):    --- Professional ---                       [phone], Colonoscopy, flexible; with biopsy, single or                        multiple Diagnosis Code(s):    --- Professional ---                       Z86.010, Personal history of colonic polyps                       D12.4, Benign neoplasm of descending colon                       K64.0, First degree hemorrhoids CPT copyright 2016 American Medical Association. All rights reserved. The codes documented in this report are preliminary and upon coder review may  be revised to meet current compliance requirements. Lollie Sails, MD 05/11/2017 10:08:26 AM This report has been  signed electronically. Number of Addenda: 0 Note Initiated On: 05/11/2017 9:28 AM Scope Withdrawal Time: 0 hours 9 minutes 40 seconds  Total Procedure Duration: 0  hours 22 minutes 49 seconds       [facility]

## 2017-05-12 ENCOUNTER — Encounter: Payer: Self-pay | Admitting: Gastroenterology

## 2017-05-12 LAB — SURGICAL PATHOLOGY

## 2017-05-19 ENCOUNTER — Ambulatory Visit
Admission: RE | Admit: 2017-05-19 | Discharge: 2017-05-19 | Disposition: A | Discharge status: Home / Self Care | Payer: Medicare Other | Source: Ambulatory Visit | Attending: Gastroenterology | Admitting: Gastroenterology

## 2017-05-19 DIAGNOSIS — K5669 Other partial intestinal obstruction: Secondary | ICD-10-CM | POA: Insufficient documentation

## 2017-05-19 DIAGNOSIS — D259 Leiomyoma of uterus, unspecified: Secondary | ICD-10-CM | POA: Diagnosis not present

## 2017-05-19 DIAGNOSIS — R9389 Abnormal findings on diagnostic imaging of other specified body structures: Secondary | ICD-10-CM | POA: Insufficient documentation

## 2017-05-19 LAB — POCT I-STAT CREATININE: Creatinine, Ser: 0.8 mg/dL (ref 0.44–1.00)

## 2017-05-19 MED ORDER — IOPAMIDOL (ISOVUE-300) INJECTION 61%
100.0000 mL | Freq: Once | INTRAVENOUS | Status: AC | PRN
  Administered 2017-05-19: 100 mL via INTRAVENOUS

## 2017-05-22 NOTE — Telephone Encounter (Signed)
No entry 

## 2017-11-13 ENCOUNTER — Telehealth: Payer: Self-pay | Admitting: Adult Health

## 2017-11-13 ENCOUNTER — Encounter: Payer: Self-pay | Admitting: Adult Health

## 2017-11-13 ENCOUNTER — Inpatient Hospital Stay: Payer: Medicare Other | Attending: Adult Health | Admitting: Adult Health

## 2017-11-13 ENCOUNTER — Inpatient Hospital Stay: Payer: Medicare Other

## 2017-11-13 VITALS — BP 134/93 | HR 92 | Temp 98.4°F | Resp 18 | Ht 64.0 in | Wt 164.8 lb

## 2017-11-13 DIAGNOSIS — Z803 Family history of malignant neoplasm of breast: Secondary | ICD-10-CM | POA: Diagnosis not present

## 2017-11-13 DIAGNOSIS — Z800 Family history of malignant neoplasm of digestive organs: Secondary | ICD-10-CM | POA: Diagnosis not present

## 2017-11-13 DIAGNOSIS — J329 Chronic sinusitis, unspecified: Secondary | ICD-10-CM | POA: Insufficient documentation

## 2017-11-13 DIAGNOSIS — K449 Diaphragmatic hernia without obstruction or gangrene: Secondary | ICD-10-CM | POA: Diagnosis not present

## 2017-11-13 DIAGNOSIS — Z79899 Other long term (current) drug therapy: Secondary | ICD-10-CM | POA: Insufficient documentation

## 2017-11-13 DIAGNOSIS — Z170 Estrogen receptor positive status [ER+]: Secondary | ICD-10-CM

## 2017-11-13 DIAGNOSIS — R0602 Shortness of breath: Secondary | ICD-10-CM | POA: Diagnosis not present

## 2017-11-13 DIAGNOSIS — C50912 Malignant neoplasm of unspecified site of left female breast: Secondary | ICD-10-CM

## 2017-11-13 DIAGNOSIS — M1990 Unspecified osteoarthritis, unspecified site: Secondary | ICD-10-CM | POA: Diagnosis not present

## 2017-11-13 DIAGNOSIS — Z923 Personal history of irradiation: Secondary | ICD-10-CM | POA: Insufficient documentation

## 2017-11-13 DIAGNOSIS — Z853 Personal history of malignant neoplasm of breast: Secondary | ICD-10-CM | POA: Diagnosis present

## 2017-11-13 DIAGNOSIS — K219 Gastro-esophageal reflux disease without esophagitis: Secondary | ICD-10-CM | POA: Insufficient documentation

## 2017-11-13 DIAGNOSIS — E785 Hyperlipidemia, unspecified: Secondary | ICD-10-CM | POA: Diagnosis not present

## 2017-11-13 LAB — CBC WITH DIFFERENTIAL/PLATELET
BASOS PCT: 0 %
Basophils Absolute: 0 10*3/uL (ref 0.0–0.1)
Eosinophils Absolute: 0.2 10*3/uL (ref 0.0–0.5)
Eosinophils Relative: 3 %
HEMATOCRIT: 46.2 % (ref 34.8–46.6)
HEMOGLOBIN: 15.1 g/dL (ref 11.6–15.9)
LYMPHS PCT: 42 %
Lymphs Abs: 2.9 10*3/uL (ref 0.9–3.3)
MCH: 28.2 pg (ref 25.1–34.0)
MCHC: 32.7 g/dL (ref 31.5–36.0)
MCV: 86.2 fL (ref 79.5–101.0)
MONO ABS: 0.4 10*3/uL (ref 0.1–0.9)
Monocytes Relative: 6 %
NEUTROS ABS: 3.5 10*3/uL (ref 1.5–6.5)
NEUTROS PCT: 49 %
Platelets: 213 10*3/uL (ref 145–400)
RBC: 5.36 MIL/uL (ref 3.70–5.45)
RDW: 15.3 % — AB (ref 11.2–14.5)
WBC: 7.1 10*3/uL (ref 3.9–10.3)

## 2017-11-13 LAB — COMPREHENSIVE METABOLIC PANEL
ALK PHOS: 114 U/L (ref 40–150)
ALT: 23 U/L (ref 0–55)
ANION GAP: 8 (ref 3–11)
AST: 19 U/L (ref 5–34)
Albumin: 3.5 g/dL (ref 3.5–5.0)
BILIRUBIN TOTAL: 0.4 mg/dL (ref 0.2–1.2)
BUN: 8 mg/dL (ref 7–26)
CALCIUM: 9.2 mg/dL (ref 8.4–10.4)
CO2: 23 mmol/L (ref 22–29)
Chloride: 107 mmol/L (ref 98–109)
Creatinine, Ser: 0.95 mg/dL (ref 0.60–1.10)
GFR calc non Af Amer: >60 mL/min (ref >60)
Glucose, Bld: 108 mg/dL (ref 70–140)
POTASSIUM: 3.9 mmol/L (ref 3.5–5.1)
Sodium: 138 mmol/L (ref 136–145)
TOTAL PROTEIN: 7 g/dL (ref 6.4–8.3)

## 2017-11-13 NOTE — Telephone Encounter (Signed)
Gave avs and calendar ° °

## 2017-11-13 NOTE — Progress Notes (Signed)
CLINIC:  Survivorship   REASON FOR VISIT:  Routine follow-up for history of breast cancer.   BRIEF ONCOLOGIC HISTORY:  (1) status post left lumpectomy and sentinel lymph node biopsy April of 2012 for a pT1 pN0, stage IA invasive ductal carcinoma, grade 1, estrogen receptor positive, progesterone receptor and HER-2 negative,   (2) status post adjuvant radiation   (3) briefly on letrozole with very poor tolerance, on tamoxifensince May of 2012 with good tolerance.    INTERVAL HISTORY:  Kayla Spencer presents to the Survivorship Clinic today for routine follow-up for her history of breast cancer.  Overall, she reports feeling quite well. She stopped Tamoxifen over the summer since her BCI testing revealed low risk of recurrence and low risk of benefit from extended endocrine therapy.  She is doing well. She continues to exercise.      REVIEW OF SYSTEMS:  Review of Systems  Constitutional: Negative for appetite change, chills, fatigue, fever and unexpected weight change.  HENT:   Negative for hearing loss, lump/mass and trouble swallowing.   Eyes: Negative for eye problems and icterus.  Respiratory: Negative for chest tightness, cough and shortness of breath.   Cardiovascular: Negative for chest pain, leg swelling and palpitations.  Gastrointestinal: Negative for abdominal distention, abdominal pain, constipation, diarrhea, nausea and vomiting.  Endocrine: Negative for hot flashes.  Musculoskeletal: Negative for arthralgias.  Skin: Negative for itching and rash.  Neurological: Negative for dizziness, extremity weakness, headaches and numbness.  Hematological: Negative for adenopathy. Does not bruise/bleed easily.  Psychiatric/Behavioral: Negative for depression. The patient is not nervous/anxious.   Breast: Denies any new nodularity, masses, tenderness, nipple changes, or nipple discharge.       PAST MEDICAL/SURGICAL HISTORY:  Past Medical History:  Diagnosis Date  .  Breast cancer ([place])   . Chronic sinusitis   . Dyspnea   . GERD (gastroesophageal reflux disease)   . Headache    migraines  . History of hiatal hernia   . Hyperlipidemia   . Osteoarthritis    thumbs   Past Surgical History:  Procedure Laterality Date  . BREAST SURGERY     lt lumpectomy x 2  . BUNIONECTOMY    . CHOLECYSTECTOMY    . COLONOSCOPY WITH PROPOFOL N/A 05/11/2017   Procedure: COLONOSCOPY WITH PROPOFOL;  Surgeon: Lollie Sails, MD;  Location: [facility] ENDOSCOPY;  Service: Endoscopy;  Laterality: N/A;  . FINGER ARTHROPLASTY     thumb tendon replacement  . TONSILLECTOMY AND ADENOIDECTOMY       ALLERGIES:  Allergies  Allergen Reactions  . Simvastatin Other (See Comments)  . Sulfa Antibiotics Itching and Rash     CURRENT MEDICATIONS:  Outpatient Encounter Medications as of 11/13/2017  Medication Sig  . amLODipine (NORVASC) 5 MG tablet Take 5 mg by mouth daily.  . cyanocobalamin 1000 MCG tablet Take 1,000 mcg by mouth daily.  Marland Kitchen esomeprazole (NEXIUM) 20 MG capsule Take 20 mg by mouth daily at 12 noon.  . hydrOXYzine (ATARAX/VISTARIL) 25 MG tablet Take 25 mg by mouth daily.  . Lactobacillus-Inulin ([place]) CAPS Take 1 capsule by mouth daily.  Marland Kitchen nystatin (MYCOSTATIN/NYSTOP) 100000 UNIT/GM POWD USE AS DIRECTED   No facility-administered encounter medications on file as of 11/13/2017.      ONCOLOGIC FAMILY HISTORY:  Family History  Problem Relation Age of Onset  . Cancer Father        brain tumor  . Cancer Maternal Aunt        breast  . Cancer  Paternal Grandmother        colon  . Heart disease Paternal Grandfather      SOCIAL HISTORY:  Social History   Socioeconomic History  . Marital status: Married    Spouse name: Not on file  . Number of children: Not on file  . Years of education: Not on file  . Highest education level: Not on file  Occupational History  . Occupation: retired  Scientific laboratory technician  . Financial resource strain: Not on  file  . Food insecurity:    Worry: Not on file    Inability: Not on file  . Transportation needs:    Medical: Not on file    Non-medical: Not on file  Tobacco Use  . Smoking status: Never Smoker  . Smokeless tobacco: Never Used  Substance and Sexual Activity  . Alcohol use: Yes  . Drug use: No  . Sexual activity: Yes    Partners: Male    Birth control/protection: Post-menopausal  Lifestyle  . Physical activity:    Days per week: Not on file    Minutes per session: Not on file  . Stress: Not on file  Relationships  . Social connections:    Talks on phone: Not on file    Gets together: Not on file    Attends religious service: Not on file    Active member of club or organization: Not on file    Attends meetings of clubs or organizations: Not on file    Relationship status: Not on file  . Intimate partner violence:    Fear of current or ex partner: Not on file    Emotionally abused: Not on file    Physically abused: Not on file    Forced sexual activity: Not on file  Other Topics Concern  . Not on file  Social History Narrative  . Not on file     PHYSICAL EXAMINATION:  Vital Signs: Vitals:   11/13/17 1115  BP: (!) 107/56  Pulse: 69  Resp: 18  Temp: 98.4 F (36.9 C)  SpO2: 100%   Filed Weights   11/13/17 1115  Weight: 156 lb 6.4 oz (70.9 kg)   General: Well-nourished, well-appearing female in no acute distress.  Unaccompanied today.   HEENT: Head is normocephalic.  Pupils equal and reactive to light. Conjunctivae clear without exudate.  Sclerae anicteric. Oral mucosa is pink, moist.  Oropharynx is pink without lesions or erythema.  Lymph: No cervical, supraclavicular, or infraclavicular lymphadenopathy noted on palpation.  Cardiovascular: Regular rate and rhythm.Marland Kitchen Respiratory: Clear to auscultation bilaterally. Chest expansion symmetric; breathing non-labored.  Breast Exam:  -Left breast: No appreciable masses on palpation. No skin redness, thickening, or  peau d'orange appearance; no nipple retraction or nipple discharge; mild distortion in symmetry at previous lumpectomy site well healed scar without erythema or nodularity.  -Right breast: No appreciable masses on palpation. No skin redness, thickening, or peau d'orange appearance; no nipple retraction or nipple discharge;  -Axilla: No axillary adenopathy bilaterally.  GI: Abdomen soft and round; non-tender, non-distended. Bowel sounds normoactive. No hepatosplenomegaly.   GU: Deferred.  Neuro: No focal deficits. Steady gait.  Psych: Mood and affect normal and appropriate for situation.  MSK: No focal spinal tenderness to palpation, full range of motion in bilateral upper extremities Extremities: No edema. Skin: Warm and dry.  LABORATORY DATA:  None for this visit   DIAGNOSTIC IMAGING:  Most recent mammogram:      ASSESSMENT AND PLAN:  Ms.. Spencer is a pleasant  67 y.o. female with history of Stage IA left breast invasive ductal carcinoma, ER+/PR+/HER2-, diagnosed in 11/2010, treated with lumpectomy, adjuvant radiation therapy, and anti-estrogen therapy with Tamoxifen x 6 years completed in 01/2017.  She presents to the Survivorship Clinic for surveillance and routine follow-up.   1. History of breast cancer:  Kayla Spencer is currently clinically and radiographically without evidence of disease or recurrence of breast cancer. She will be due for mammogram in in a few weeks.  She has stopped tamoxifen as there was no benefit noted.  We reviewed that she is at low risk for recurrence.  She would like to still f/u with [place] annually at the long term survivorship clinic.  I encouraged her to call me with any questions or concerns before her next visit at the cancer center, and I would be happy to see her sooner, if needed.    2. Bone health:  Given Kayla Spencer's age, history of breast cancer, she is at risk for bone demineralization. Tamoxifen has a protective effect on the bones.  We will defer to her  PCP regarding future bone density testing and management.   3. Cancer screening:  Due to Kayla Spencer's history and her age, she should receive screening for skin cancers, colon cancer, and gynecologic cancers (if needed). She was encouraged to follow-up with her PCP for appropriate cancer screenings.   4. Health maintenance and wellness promotion: Kayla Spencer was encouraged to consume 5-7 servings of fruits and vegetables per day. She was also encouraged to engage in moderate to vigorous exercise for 30 minutes per day most days of the week. She was instructed to limit her alcohol consumption and continue to abstain from tobacco use.    Dispo:  -Return to cancer center in one year for LTS follow up -Annual mammograms in May    A total of (30) minutes of face-to-face time was spent with this patient with greater than 50% of that time in counseling and care-coordination.   Gardenia Phlegm, NP Survivorship Program [facility] [phone]   Note: PRIMARY CARE PROVIDER Juluis Pitch, [place] [phone]

## 2018-11-11 ENCOUNTER — Telehealth: Payer: Self-pay | Admitting: Oncology

## 2018-11-11 NOTE — Telephone Encounter (Signed)
Per 4/7 schedule message moved 4/13 appointments to September. Confirmed with patient.

## 2018-11-15 ENCOUNTER — Inpatient Hospital Stay: Payer: Medicare Other | Admitting: Adult Health

## 2018-11-15 ENCOUNTER — Inpatient Hospital Stay: Payer: Medicare Other

## 2018-12-21 ENCOUNTER — Encounter: Payer: Self-pay | Admitting: Oncology

## 2019-04-06 ENCOUNTER — Inpatient Hospital Stay: Payer: Medicare Other | Admitting: Adult Health

## 2019-04-06 ENCOUNTER — Inpatient Hospital Stay: Payer: Medicare Other

## 2020-02-17 ENCOUNTER — Other Ambulatory Visit: Payer: Self-pay | Admitting: Obstetrics & Gynecology

## 2020-02-27 ENCOUNTER — Other Ambulatory Visit: Payer: Self-pay | Admitting: Obstetrics & Gynecology

## 2020-02-27 DIAGNOSIS — Z853 Personal history of malignant neoplasm of breast: Secondary | ICD-10-CM

## 2020-02-27 DIAGNOSIS — N6459 Other signs and symptoms in breast: Secondary | ICD-10-CM

## 2020-02-27 DIAGNOSIS — N63 Unspecified lump in unspecified breast: Secondary | ICD-10-CM

## 2020-03-02 ENCOUNTER — Other Ambulatory Visit: Payer: Self-pay | Admitting: Obstetrics & Gynecology

## 2020-03-02 DIAGNOSIS — Z853 Personal history of malignant neoplasm of breast: Secondary | ICD-10-CM

## 2020-03-02 DIAGNOSIS — N6459 Other signs and symptoms in breast: Secondary | ICD-10-CM

## 2020-03-02 DIAGNOSIS — N63 Unspecified lump in unspecified breast: Secondary | ICD-10-CM

## 2020-03-09 ENCOUNTER — Ambulatory Visit
Admission: RE | Admit: 2020-03-09 | Discharge: 2020-03-09 | Disposition: A | Discharge status: Home / Self Care | Payer: Medicare Other | Source: Ambulatory Visit | Attending: Obstetrics & Gynecology | Admitting: Obstetrics & Gynecology

## 2020-03-09 ENCOUNTER — Other Ambulatory Visit: Payer: Self-pay

## 2020-03-09 DIAGNOSIS — Z853 Personal history of malignant neoplasm of breast: Secondary | ICD-10-CM

## 2020-03-09 DIAGNOSIS — N63 Unspecified lump in unspecified breast: Secondary | ICD-10-CM

## 2020-03-09 DIAGNOSIS — N6459 Other signs and symptoms in breast: Secondary | ICD-10-CM

## 2020-03-13 ENCOUNTER — Other Ambulatory Visit: Payer: Self-pay

## 2020-03-13 ENCOUNTER — Ambulatory Visit (INDEPENDENT_AMBULATORY_CARE_PROVIDER_SITE_OTHER): Payer: Medicare Other | Admitting: Dermatology

## 2020-03-13 DIAGNOSIS — L720 Epidermal cyst: Secondary | ICD-10-CM | POA: Diagnosis not present

## 2020-03-13 DIAGNOSIS — R202 Paresthesia of skin: Secondary | ICD-10-CM

## 2020-03-13 DIAGNOSIS — L719 Rosacea, unspecified: Secondary | ICD-10-CM

## 2020-03-13 DIAGNOSIS — D485 Neoplasm of uncertain behavior of skin: Secondary | ICD-10-CM

## 2020-03-13 MED ORDER — METRONIDAZOLE 1 % EX CREA: TOPICAL_CREAM | Freq: Every day | CUTANEOUS | 3 refills | Status: DC

## 2020-03-13 MED ORDER — TRETINOIN 0.05 % EX CREA: TOPICAL_CREAM | CUTANEOUS | 3 refills | Status: DC

## 2020-03-13 NOTE — Progress Notes (Signed)
Follow-Up Visit   Subjective  Kayla Spencer is a 69 y.o. female who presents for the following: Annual Exam (No history of skin cancer) and spot (left lower leg, present for years, hits with razor and bleeds). She has rosacea and milia and uses metrogel and tretinoin prn.  Itchy spot on her back, but nothing there.   The following portions of the chart were reviewed this encounter and updated as appropriate:      Review of Systems:  No other skin or systemic complaints except as noted in HPI or Assessment and Plan.  Objective  Well appearing patient in no apparent distress; mood and affect are within normal limits.  A full examination was performed including scalp, head, eyes, ears, nose, lips, neck, chest, axillae, abdomen, back, buttocks, bilateral upper extremities, bilateral lower extremities, hands, feet, fingers, toes, fingernails, and toenails. All findings within normal limits unless otherwise noted below.  Objective  Face: Inflammatory papule on R nasal dorsum; erythema on malar cheeks, nose.  Objective  Cheeks: Smooth white papule(s).   Objective  Left Inferior Knee: 4.76mm firm light brown nodule  Objective  Left Upper Back: Clear.   Assessment & Plan   Skin cancer screening performed today.  Actinic Damage - diffuse scaly erythematous macules with underlying dyspigmentation - Recommend daily broad spectrum sunscreen SPF 30+ to sun-exposed areas, reapply every 2 hours as needed.  - Call for new or changing lesions.  Lentigines - Scattered tan macules - Discussed due to sun exposure - Benign, observe - Call for any changes Sebaceous Hyperplasia - Small yellow papules with a central dell - Benign - Observe Seborrheic Keratoses - Stuck-on, waxy, tan-brown papules and plaques  - Discussed benign etiology and prognosis. - Observe - Call for any changes Hemangiomas - Red papules - Discussed benign nature - Observe - Call for any  changes  Acrochordons (Skin Tags) - Fleshy, skin-colored pedunculated papules - Benign appearing.  - Observe. - If desired, they can be removed with an in office procedure that is not covered by insurance.  May be covered if symptomatic. - Please call the clinic if you notice any new or changing lesions.  Dermatofibroma - Firm pink/brown papulenodule with dimple sign of the left posterior shoulder, R post ankle, L lat ankle - Benign appearing - Call for any changes  Varicose Veins - Dilated blue, purple or red veins at the medial and lateral knees - Reassured - These can be treated by sclerotherapy (a procedure to inject a medicine into the veins to make them disappear) if desired, but the treatment is not covered by insurance. Patient will schedule for the fall.  Xerosis - diffuse xerotic patches - recommend gentle, hydrating skin care, May try Eucerin 12 hr itch relief with Menthol, Gold Bond, CeraVe. Dove Bodywash sample given. - gentle skin care handout given   Rosacea Face  Switch to Metronidazole 1% Cream Apply to face QHS.  metronidazole (NORITATE) 1 % cream - Face  Milia Cheeks  Continue tretinoin 0.05% cream Apply to face QHS after applying metronidazole cream.  Topical retinoid medications like tretinoin/Retin-A, adapalene/Differin, tazarotene/Fabior, and Epiduo/Epiduo Forte can cause dryness and irritation when first started. Only apply a pea-sized amount to the entire affected area. Avoid applying it around the eyes, edges of mouth and creases at the nose. If you experience irritation, use a good moisturizer first and/or apply the medicine less often. If you are doing well with the medicine, you can increase how often you use it until  you are applying every night. Be careful with sun protection while using this medication as it can make you sensitive to the sun. This medicine should not be used by pregnant women.    Neoplasm of uncertain behavior of skin Left  Inferior Knee  Epidermal / dermal shaving  Lesion diameter (cm):  0.4 Informed consent: discussed and consent obtained   Patient was prepped and draped in usual sterile fashion: Area prepped with alcohol. Anesthesia: the lesion was anesthetized in a standard fashion   Anesthetic:  1% lidocaine w/ epinephrine 1-100,000 buffered w/ 8.4% NaHCO3 Instrument used: flexible razor blade   Hemostasis achieved with: pressure, aluminum chloride and electrodesiccation   Outcome: patient tolerated procedure well   Post-procedure details: wound care instructions given   Post-procedure details comment:  Ointment and small bandage applied Additional details:  Post tx defect 0.7cm.  Specimen 1 - Surgical pathology Differential Diagnosis: Irritated Dermatofibroma vs other Check Margins: No 4.6mm firm light brown nodule  Notalgia paresthetica Left Upper Back  Discussed topical steroids for itch, pt defers today.  Return in about 1 year (around 03/13/2021) for TBSE, also schedule in fall for sclerotherapy.   IJamesetta Orleans, CMA, am acting as scribe for Brendolyn Patty, MD .  Documentation: I have reviewed the above documentation for accuracy and completeness, and I agree with the above.  Brendolyn Patty MD

## 2020-03-13 NOTE — Patient Instructions (Addendum)
At night, apply metronidazole cream to face, followed by tretinoin cream.  Gentle Skin Care Guide  1. Bathe no more than once a day.  2. Avoid bathing in hot water  3. Use a mild soap like Dove, Vanicream, Cetaphil, CeraVe. Can use Lever 2000 or Cetaphil antibacterial soap  4. Use soap only where you need it. On most days, use it under your arms, between your legs, and on your feet. Let the water rinse other areas unless visibly dirty.  5. When you get out of the bath/shower, use a towel to gently blot your skin dry, don't rub it.  6. While your skin is still a little damp, apply a moisturizing cream such as Vanicream, CeraVe, Cetaphil, Eucerin, Sarna lotion or plain Vaseline Jelly. For hands apply Neutrogena [place] Hand Cream or Excipial Hand Cream.  7. Reapply moisturizer any time you start to itch or feel dry.  8. Sometimes using free and clear laundry detergents can be helpful. Fabric softener sheets should be avoided. Downy Free & Gentle liquid, or any liquid fabric softener that is free of dyes and perfumes, it acceptable to use  9. If your doctor has given you prescription creams you may apply moisturizers over them    Wound Care Instructions  1. Cleanse wound gently with soap and water once a day then pat dry with clean gauze. Apply a thing coat of Petrolatum (petroleum jelly, "Vaseline") over the wound (unless you have an allergy to this). We recommend that you use a new, sterile tube of Vaseline. Do not pick or remove scabs. Do not remove the yellow or white "healing tissue" from the base of the wound.  2. Cover the wound with fresh, clean, nonstick gauze and secure with paper tape. You may use Band-Aids in place of gauze and tape if the would is small enough, but would recommend trimming much of the tape off as there is often too much. Sometimes Band-Aids can irritate the skin.  3. You should call the office for your biopsy report after 1 week if you have not already been  contacted.  4. If you experience any problems, such as abnormal amounts of bleeding, swelling, significant bruising, significant pain, or evidence of infection, please call the office immediately.  5. FOR ADULT SURGERY PATIENTS: If you need something for pain relief you may take 1 extra strength Tylenol (acetaminophen) AND 2 Ibuprofen (200mg  each) together every 4 hours as needed for pain. (do not take these if you are allergic to them or if you have a reason you should not take them.) Typically, you may only need pain medication for 1 to 3 days.    BEFORE YOUR APPOINTMENT FOR SCLEROTHERAPY  1. When you telephone for your appointment for the sclerotherapy procedure, please let the receptionist know that you are scheduling for the fifteen (15) minute sclerotherapy procedure not just a regular visit.  2. On the day of the procedure, please cleanse and dry the areas, but do not use any moisturizers or other products on the area(s) to be treated.  3. Bring a pair of comfortable shorts to wear during the procedure.  4. Be sure to bring your recommended graduated compression stockings with you to the office. You will be wearing them home when your visit is over. These compression hose can be purchased at most medical supply stores.  After Your Sclerotherapy Procedure  1. Please wear the graduated compression stockings for 24 hours immediately following the completion of the sclerotherapy procedure.  2.  We recommend that you avoid vigorous activity as much as possible for the first twenty-four (24) hours. You can do your "normal" routine, but avoid an above normal amount of time on your feet. Elevating the legs when sitting and avoidance of vigorous leg movements or exercise in the first few days after treatment may improve your results.  3. You may remove the compression dressings (cotton balls) and tape the next morning.  4. Please continue wearing the compression stockings during waking hours  for the two (2) weeks following sclerotherapy.  5. If you have any blisters, sores or ulcers or other problems following your procedure please call or return to the office immediately.     THE PROCEDURE FEE IS $350.00 PER FIFTEEN (15) MINUTE SESSION. WE REQUIRE THAT THIS PROCEDURE BE PAID FOR IN FULL ON OR BEFORE THE DATE THAT IT IS PERFORMED. WE WILL GIVE YOU A RECEIPT THAT YOU CAN FILE WITH YOUR INSURANCE COMPANY. WE GENERALLY DO NOT FILE THIS PROCEDURE WITH ANY INSURANCE COMPANY EXCEPT UNDER CERTAIN CIRCUMSTANCES WHERE PRIOR AUTHORIZATION HAS BEEN CONFIRMED. THIS PROCEDURE IS GENERALLY CONSIDERED TO BE A COSMETIC PROCEDURE BY INSURANCE COMPANIES.

## 2020-03-19 ENCOUNTER — Telehealth: Payer: Self-pay

## 2020-03-19 NOTE — Telephone Encounter (Signed)
Advised pt of bx results/sh ?

## 2020-03-19 NOTE — Telephone Encounter (Signed)
-----   Message from Brendolyn Patty, MD sent at 03/19/2020  8:55 AM EDT ----- Skin , left inferior knee DERMATOFIBROMA  Benign, may recur

## 2020-05-28 ENCOUNTER — Ambulatory Visit (INDEPENDENT_AMBULATORY_CARE_PROVIDER_SITE_OTHER): Payer: Medicare Other | Admitting: Dermatology

## 2020-05-28 ENCOUNTER — Other Ambulatory Visit: Payer: Self-pay

## 2020-05-28 DIAGNOSIS — I781 Nevus, non-neoplastic: Secondary | ICD-10-CM

## 2020-05-28 NOTE — Progress Notes (Signed)
° °  Follow-Up Visit   Subjective  Kayla Spencer is a 69 y.o. female who presents for the following: Procedure (Sclerotherapy today.).    The following portions of the chart were reviewed this encounter and updated as appropriate:      Review of Systems:  No other skin or systemic complaints except as noted in HPI or Assessment and Plan.  Objective  Well appearing patient in no apparent distress; mood and affect are within normal limits.  A focused examination was performed including bil legs. Relevant physical exam findings are noted in the Assessment and Plan.  Objective  bil legs: Varicies and telangiectasias bil legs  Images                       Assessment & Plan  Spider veins bil legs  Sclerotherapy today to R lat thigh, L medial thigh just above knee, L lat/post thigh, R medial knee  Discussed risk of bruising, PIPA  Sclerotherapy - bil legs The patient presents for desired sclerotherapy for desired treatment of desired treatment of small to medium red blue asymptomatic varicosities of the R lat thigh, L medial thigh just above knee, L lat/post thigh, R medial knee Procedure: The patient was counseled and understands about the effects, side effects and potential risks and complications of the sclerotherapy procedure. The patient was given the opportunity to ask questions. Asclera (polidocanol) 1% (total 2cc) was injected into the varices. In order to ensure correct placement of the catheter in the vein, I drew back slightly to give moderate blood show in the syringe. If there was any evidence or suspicion of extravasation of sclerosant, the area was immediately diluted with a large volume of 0.9% saline. A pressure dressing was applied immediately to the injected sites. The patient tolerated the procedure well without complication. The patient was instructed in post-operative compression stocking use. The patient understands to call or return immediately  if any problems noted.   Return in about 6 weeks (around 07/09/2020) for Sclero f/u.   I, Othelia Pulling, RMA, am acting as scribe for Brendolyn Patty, MD . Documentation: I have reviewed the above documentation for accuracy and completeness, and I agree with the above.  Brendolyn Patty MD

## 2020-05-28 NOTE — Patient Instructions (Signed)
BEFORE YOUR APPOINTMENT FOR SCLEROTHERAPY  1. When you telephone for your appointment for the sclerotherapy procedure, please let the receptionist know that you are scheduling for the fifteen (15) minute sclerotherapy procedure not just a regular visit.  2. On the day of the procedure, please cleanse and dry the areas, but do not use any moisturizers or other products on the area(s) to be treated.  3. Bring a pair of comfortable shorts to wear during the procedure.  4. Be sure to bring your recommended graduated compression stockings with you to the office. You will be wearing them home when your visit is over. These compression hose can be purchased at most medical supply stores.  After Your Sclerotherapy Procedure  1. Please wear the graduated compression stockings for 24 hours immediately following the completion of the sclerotherapy procedure.  2. We recommend that you avoid vigorous activity as much as possible for the first twenty-four (24) hours. You can do your "normal" routine, but avoid an above normal amount of time on your feet. Elevating the legs when sitting and avoidance of vigorous leg movements or exercise in the first few days after treatment may improve your results.  3. You may remove the compression dressings (cotton balls) and tape the next morning.  4. Please continue wearing the compression stockings during waking hours for the two (2) weeks following sclerotherapy.  5. If you have any blisters, sores or ulcers or other problems following your procedure please call or return to the office immediately.     THE PROCEDURE FEE IS $350.00 PER FIFTEEN (15) MINUTE SESSION. WE REQUIRE THAT THIS PROCEDURE BE PAID FOR IN FULL ON OR BEFORE THE DATE THAT IT IS PERFORMED. WE WILL GIVE YOU A RECEIPT THAT YOU CAN FILE WITH YOUR INSURANCE COMPANY. WE GENERALLY DO NOT FILE THIS PROCEDURE WITH ANY INSURANCE COMPANY EXCEPT UNDER CERTAIN CIRCUMSTANCES WHERE PRIOR AUTHORIZATION HAS BEEN  CONFIRMED. THIS PROCEDURE IS GENERALLY CONSIDERED TO BE A COSMETIC PROCEDURE BY INSURANCE COMPANIES. 

## 2020-07-10 ENCOUNTER — Ambulatory Visit (INDEPENDENT_AMBULATORY_CARE_PROVIDER_SITE_OTHER): Payer: Self-pay | Admitting: Dermatology

## 2020-07-10 ENCOUNTER — Other Ambulatory Visit: Payer: Self-pay

## 2020-07-10 DIAGNOSIS — I8393 Asymptomatic varicose veins of bilateral lower extremities: Secondary | ICD-10-CM

## 2020-07-10 NOTE — Patient Instructions (Signed)

## 2020-07-10 NOTE — Progress Notes (Signed)
   Follow-Up Visit   Subjective  Kayla Spencer is a 69 y.o. female who presents for the following: Follow-up (Patient here today for 6 week sclerotherapy follow up. ).  Areas treated were R lat thigh, L medial thigh just above knee, L lat/post thigh, R medial knee. Patient advises there is some improvement.   The following portions of the chart were reviewed this encounter and updated as appropriate:      Review of Systems:  No other skin or systemic complaints except as noted in HPI or Assessment and Plan.  Objective  Well appearing patient in no apparent distress; mood and affect are within normal limits.  A focused examination was performed including bilateral legs. Relevant physical exam findings are noted in the Assessment and Plan.  Objective  Bilateral legs: Linear pink brown streaks R lateral knee, L medial knee c/w resolved spider veins with PIPA.  Also remaining intact spider veins.  About 50% improvement noted when compared to baseline photos   Assessment & Plan  Spider veins of both lower extremities Bilateral legs  Post-op sclerotherapy- Patient with about 50% improvement since sclerotherapy procedure 6 weeks ago.  Photos compared.  Recommend additional treatment to get additional improvement  Return for as scheduled for TBSE.  Graciella Belton, RMA, am acting as scribe for Brendolyn Patty, MD . Documentation: I have reviewed the above documentation for accuracy and completeness, and I agree with the above.  Brendolyn Patty MD

## 2020-10-22 ENCOUNTER — Other Ambulatory Visit: Payer: Self-pay | Admitting: Obstetrics & Gynecology

## 2020-10-22 DIAGNOSIS — Z1231 Encounter for screening mammogram for malignant neoplasm of breast: Secondary | ICD-10-CM

## 2020-12-24 ENCOUNTER — Ambulatory Visit
Admission: RE | Admit: 2020-12-24 | Discharge: 2020-12-24 | Disposition: A | Discharge status: Home / Self Care | Payer: Medicare Other | Source: Ambulatory Visit | Attending: Obstetrics & Gynecology | Admitting: Obstetrics & Gynecology

## 2020-12-24 ENCOUNTER — Other Ambulatory Visit: Payer: Self-pay

## 2020-12-24 DIAGNOSIS — Z1231 Encounter for screening mammogram for malignant neoplasm of breast: Secondary | ICD-10-CM | POA: Insufficient documentation

## 2020-12-24 HISTORY — DX: Personal history of irradiation: Z92.3

## 2021-03-19 ENCOUNTER — Other Ambulatory Visit: Payer: Self-pay

## 2021-03-19 ENCOUNTER — Ambulatory Visit (INDEPENDENT_AMBULATORY_CARE_PROVIDER_SITE_OTHER): Payer: Medicare Other | Admitting: Dermatology

## 2021-03-19 DIAGNOSIS — L570 Actinic keratosis: Secondary | ICD-10-CM

## 2021-03-19 DIAGNOSIS — D2262 Melanocytic nevi of left upper limb, including shoulder: Secondary | ICD-10-CM

## 2021-03-19 DIAGNOSIS — L814 Other melanin hyperpigmentation: Secondary | ICD-10-CM

## 2021-03-19 DIAGNOSIS — L719 Rosacea, unspecified: Secondary | ICD-10-CM | POA: Diagnosis not present

## 2021-03-19 DIAGNOSIS — L304 Erythema intertrigo: Secondary | ICD-10-CM

## 2021-03-19 DIAGNOSIS — L821 Other seborrheic keratosis: Secondary | ICD-10-CM

## 2021-03-19 DIAGNOSIS — D229 Melanocytic nevi, unspecified: Secondary | ICD-10-CM

## 2021-03-19 DIAGNOSIS — L918 Other hypertrophic disorders of the skin: Secondary | ICD-10-CM

## 2021-03-19 DIAGNOSIS — D2372 Other benign neoplasm of skin of left lower limb, including hip: Secondary | ICD-10-CM

## 2021-03-19 DIAGNOSIS — D1800 Hemangioma unspecified site: Secondary | ICD-10-CM

## 2021-03-19 DIAGNOSIS — L820 Inflamed seborrheic keratosis: Secondary | ICD-10-CM | POA: Diagnosis not present

## 2021-03-19 DIAGNOSIS — L578 Other skin changes due to chronic exposure to nonionizing radiation: Secondary | ICD-10-CM

## 2021-03-19 DIAGNOSIS — D2371 Other benign neoplasm of skin of right lower limb, including hip: Secondary | ICD-10-CM

## 2021-03-19 DIAGNOSIS — Z1283 Encounter for screening for malignant neoplasm of skin: Secondary | ICD-10-CM

## 2021-03-19 MED ORDER — KETOCONAZOLE 2 % EX CREA: 1.0000 "application " | TOPICAL_CREAM | Freq: Every evening | CUTANEOUS | 4 refills | Status: AC

## 2021-03-19 NOTE — Progress Notes (Signed)
Follow-Up Visit   Subjective  Kayla Spencer is a 70 y.o. female who presents for the following: Annual Exam (Patient here today for 1 year tbse.  Patient reports a couple spots at right arm at forearm she wants to pick. ) As well on her R neck and R axilla and mid chest. She also has a rash lower abdomen that flares off and on, and a small white flaky spot on her nose.  Patient here for full body skin exam and skin cancer screening.  The following portions of the chart were reviewed this encounter and updated as appropriate:      Objective  Well appearing patient in no apparent distress; mood and affect are within normal limits.  A full examination was performed including scalp, head, eyes, ears, nose, lips, neck, chest, axillae, abdomen, back, buttocks, bilateral upper extremities, bilateral lower extremities, hands, feet, fingers, toes, fingernails, and toenails. All findings within normal limits unless otherwise noted below.  mid face and cheeks Erythema at midface with telangectasia   right elbow x 1, right neck x 1, posterior right upper arm x 1, right anterior axilla x 1, inframammary x 1 (5) Erythematous keratotic or waxy stuck-on papule   Waxy pedunculated papule at right neck   suprapubic crease Linear erythematous patch in fold  left nasal tip Tiny pink scaly macule  Assessment & Plan  Rosacea mid face and cheeks  Rosacea is a chronic progressive skin condition usually affecting the face of adults, causing redness and/or acne bumps. It is treatable but not curable. It sometimes affects the eyes (ocular rosacea) as well. It may respond to topical and/or systemic medication and can flare with stress, sun exposure, alcohol, exercise and some foods.  Daily application of broad spectrum spf 30+ sunscreen to face is recommended to reduce flares.  Continue metronidazole 1 % cream qHS  Related Medications metronidazole (NORITATE) 1 % cream Apply topically  daily.  Inflamed seborrheic keratosis right elbow x 1, right neck x 1, posterior right upper arm x 1, right anterior axilla x 1, inframammary x 1  Destruction of lesion - right elbow x 1, right neck x 1, posterior right upper arm x 1, right anterior axilla x 1, inframammary x 1  Destruction method: cryotherapy   Informed consent: discussed and consent obtained   Lesion destroyed using liquid nitrogen: Yes   Region frozen until ice ball extended beyond lesion: Yes   Outcome: patient tolerated procedure well with no complications   Post-procedure details: wound care instructions given   Additional details:  Prior to procedure, discussed risks of blister formation, small wound, skin dyspigmentation, or rare scar following cryotherapy. Recommend Vaseline ointment to treated areas while healing.   Erythema intertrigo suprapubic crease  Intertrigo is a chronic recurrent rash that occurs in skin fold areas that may be associated with friction; heat; moisture; yeast; fungus; and bacteria.  It is exacerbated by increased movement / activity; sweating; and higher atmospheric temperature.   Start ketoconazle 2 % cream - apply to affected area nightly  Zeasorb AF powder qam    ketoconazole (NIZORAL) 2 % cream - suprapubic crease Apply 1 application topically at bedtime. Apply at affected area of groin  Actinic keratosis left nasal tip  Actinic keratoses are precancerous spots that appear secondary to cumulative UV radiation exposure/sun exposure over time. They are chronic with expected duration over 1 year. A portion of actinic keratoses will progress to squamous cell carcinoma of the skin. It is not possible to  reliably predict which spots will progress to skin cancer and so treatment is recommended to prevent development of skin cancer.  Recommend daily broad spectrum sunscreen SPF 30+ to sun-exposed areas, reapply every 2 hours as needed.  Recommend staying in the shade or wearing long  sleeves, sun glasses (UVA+UVB protection) and wide brim hats (4-inch brim around the entire circumference of the hat). Call for new or changing lesions.  Destruction of lesion - left nasal tip  Destruction method: cryotherapy   Informed consent: discussed and consent obtained   Lesion destroyed using liquid nitrogen: Yes   Region frozen until ice ball extended beyond lesion: Yes   Outcome: patient tolerated procedure well with no complications   Post-procedure details: wound care instructions given   Additional details:  Prior to procedure, discussed risks of blister formation, small wound, skin dyspigmentation, or rare scar following cryotherapy. Recommend Vaseline ointment to treated areas while healing.   Lentigines - Scattered tan macules - Due to sun exposure - Benign-appering, observe - Recommend daily broad spectrum sunscreen SPF 30+ to sun-exposed areas, reapply every 2 hours as needed. - Call for any changes  Seborrheic Keratoses - Stuck-on, waxy, tan-brown papules and/or plaques  - Benign-appearing - Discussed benign etiology and prognosis. - Observe - Call for any changes  Acrochordons (Skin Tags) - Fleshy, skin-colored pedunculated papules - Benign appearing.  - Observe. - If desired, they can be removed with an in office procedure that is not covered by insurance. - Please call the clinic if you notice any new or changing lesions.  Melanocytic Nevi - Tan-brown and/or pink-flesh-colored symmetric macules and papules - Benign appearing on exam today - Observation - Call clinic for new or changing moles - Recommend daily use of broad spectrum spf 30+ sunscreen to sun-exposed areas.   Dermatofibroma - Firm pink/brown papulenodule with dimple sign at  left inferior knee, left ankle x 2, right ankle, left posterior shoulder, right posterior ankle, right medial thigh - Benign appearing - Call for any changes  Hemangiomas - Red papules - Discussed benign nature -  Observe - Call for any changes  Actinic Damage - Chronic condition, secondary to cumulative UV/sun exposure - diffuse scaly erythematous macules with underlying dyspigmentation - Recommend daily broad spectrum sunscreen SPF 30+ to sun-exposed areas, reapply every 2 hours as needed.  - Staying in the shade or wearing long sleeves, sun glasses (UVA+UVB protection) and wide brim hats (4-inch brim around the entire circumference of the hat) are also recommended for sun protection.  - Call for new or changing lesions.  Skin cancer screening performed today.  Return in about 1 year (around 03/19/2022) for tbse. I, Ruthell Rummage, CMA, am acting as scribe for Brendolyn Patty, MD.  Documentation: I have reviewed the above documentation for accuracy and completeness, and I agree with the above.  Brendolyn Patty MD

## 2021-03-19 NOTE — Patient Instructions (Addendum)
Cryotherapy Aftercare  Wash gently with soap and water everyday.   Apply Vaseline and Band-Aid daily until healed.   Actinic keratoses are precancerous spots that appear secondary to cumulative UV radiation exposure/sun exposure over time. They are chronic with expected duration over 1 year. A portion of actinic keratoses will progress to squamous cell carcinoma of the skin. It is not possible to reliably predict which spots will progress to skin cancer and so treatment is recommended to prevent development of skin cancer.  Recommend daily broad spectrum sunscreen SPF 30+ to sun-exposed areas, reapply every 2 hours as needed.  Recommend staying in the shade or wearing long sleeves, sun glasses (UVA+UVB protection) and wide brim hats (4-inch brim around the entire circumference of the hat). Call for new or changing lesions.   Seborrheic Keratosis  What causes seborrheic keratoses? Seborrheic keratoses are harmless, common skin growths that first appear during adult life.  As time goes by, more growths appear.  Some people may develop a large number of them.  Seborrheic keratoses appear on both covered and uncovered body parts.  They are not caused by sunlight.  The tendency to develop seborrheic keratoses can be inherited.  They vary in color from skin-colored to gray, brown, or even black.  They can be either smooth or have a rough, warty surface.   Seborrheic keratoses are superficial and look as if they were stuck on the skin.  Under the microscope this type of keratosis looks like layers upon layers of skin.  That is why at times the top layer may seem to fall off, but the rest of the growth remains and re-grows.    Treatment Seborrheic keratoses do not need to be treated, but can easily be removed in the office.  Seborrheic keratoses often cause symptoms when they rub on clothing or jewelry.  Lesions can be in the way of shaving.  If they become inflamed, they can cause itching, soreness, or  burning.  Removal of a seborrheic keratosis can be accomplished by freezing, burning, or surgery. If any spot bleeds, scabs, or grows rapidly, please return to have it checked, as these can be an indication of a skin cancer.  For itch at groin  Zeasorb AF powder - found at [facility] in athlete's foot section to help    Melanoma ABCDEs  Melanoma is the most dangerous type of skin cancer, and is the leading cause of death from skin disease.  You are more likely to develop melanoma if you: Have light-colored skin, light-colored eyes, or red or blond hair Spend a lot of time in the sun Tan regularly, either outdoors or in a tanning bed Have had blistering sunburns, especially during childhood Have a close family member who has had a melanoma Have atypical moles or large birthmarks  Early detection of melanoma is key since treatment is typically straightforward and cure rates are extremely high if we catch it early.   The first sign of melanoma is often a change in a mole or a new dark spot.  The ABCDE system is a way of remembering the signs of melanoma.  A for asymmetry:  The two halves do not match. B for border:  The edges of the growth are irregular. C for color:  A mixture of colors are present instead of an even brown color. D for diameter:  Melanomas are usually (but not always) greater than 87m - the size of a pencil eraser. E for evolution:  The spot keeps changing  in size, shape, and color.  Please check your skin once per month between visits. You can use a small mirror in front and a large mirror behind you to keep an eye on the back side or your body.   If you see any new or changing lesions before your next follow-up, please call to schedule a visit.  Please continue daily skin protection including broad spectrum sunscreen SPF 30+ to sun-exposed areas, reapplying every 2 hours as needed when you're outdoors.   Staying in the shade or wearing long sleeves, sun glasses  (UVA+UVB protection) and wide brim hats (4-inch brim around the entire circumference of the hat) are also recommended for sun protection.    If you have any questions or concerns for your doctor, please call our main line at [phone] and press option 4 to reach your doctor's medical assistant. If no one answers, please leave a voicemail as directed and we will return your call as soon as possible. Messages left after 4 pm will be answered the following business day.   You may also send [place] a message via [place]. We typically respond to MyChart messages within 1-2 business days.  For prescription refills, please ask your pharmacy to contact our office. Our fax number is [phone].  If you have an urgent issue when the clinic is closed that cannot wait until the next business day, you can page your doctor at the number below.    Please note that while we do our best to be available for urgent issues outside of office hours, we are not available 24/7.   If you have an urgent issue and are unable to reach [place], you may choose to seek medical care at your doctor's office, retail clinic, urgent care center, or emergency room.  If you have a medical emergency, please immediately call 911 or go to the emergency department.  Pager Numbers  - Dr. Nehemiah Massed: [phone]  - Dr. Laurence Ferrari: [phone]  - Dr. Nicole Kindred: [phone]  In the event of inclement weather, please call our main line at [phone] for an update on the status of any delays or closures.  Dermatology Medication Tips: Please keep the boxes that topical medications come in in order to help keep track of the instructions about where and how to use these. Pharmacies typically print the medication instructions only on the boxes and not directly on the medication tubes.   If your medication is too expensive, please contact our office at [phone] option 4 or send [place] a message through [place].   We are unable to tell what your  co-pay for medications will be in advance as this is different depending on your insurance coverage. However, we may be able to find a substitute medication at lower cost or fill out paperwork to get insurance to cover a needed medication.   If a prior authorization is required to get your medication covered by your insurance company, please allow [place] 1-2 business days to complete this process.  Drug prices often vary depending on where the prescription is filled and some pharmacies may offer cheaper prices.  The website www.goodrx.com contains coupons for medications through different pharmacies. The prices here do not account for what the cost may be with help from insurance (it may be cheaper with your insurance), but the website can give you the price if you did not use any insurance.  - You can print the associated coupon and take it with your prescription to the pharmacy.  -  You may also stop by our office during regular business hours and pick up a GoodRx coupon card.  - If you need your prescription sent electronically to a different pharmacy, notify our office through [facility] or by phone at [phone] option 4.

## 2021-08-15 ENCOUNTER — Emergency Department
Admission: EM | Admit: 2021-08-15 | Discharge: 2021-08-15 | Disposition: A | Discharge status: Home / Self Care | Payer: Medicare Other | Attending: Emergency Medicine | Admitting: Emergency Medicine

## 2021-08-15 ENCOUNTER — Other Ambulatory Visit: Payer: Self-pay

## 2021-08-15 DIAGNOSIS — R300 Dysuria: Secondary | ICD-10-CM | POA: Diagnosis present

## 2021-08-15 DIAGNOSIS — N390 Urinary tract infection, site not specified: Secondary | ICD-10-CM | POA: Diagnosis not present

## 2021-08-15 LAB — URINALYSIS, ROUTINE W REFLEX MICROSCOPIC
Bacteria, UA: NONE SEEN
Bilirubin Urine: NEGATIVE
Glucose, UA: NEGATIVE mg/dL
Ketones, ur: NEGATIVE mg/dL
Nitrite: NEGATIVE
Protein, ur: 100 mg/dL — AB
RBC / HPF: >50 RBC/hpf — ABNORMAL HIGH (ref 0–5)
Specific Gravity, Urine: 1.02 (ref 1.005–1.030)
WBC, UA: >50 WBC/hpf — ABNORMAL HIGH (ref 0–5)
pH: 5 (ref 5.0–8.0)

## 2021-08-15 MED ORDER — NITROFURANTOIN MONOHYD MACRO 100 MG PO CAPS: 100.0000 mg | ORAL_CAPSULE | Freq: Two times a day (BID) | ORAL | 0 refills | Status: AC

## 2021-08-15 NOTE — ED Notes (Signed)
D/C and new ABX and reasons to return to ED discussed with pt, pt verbalized understanding. NAD noted. Pt ambulatory on D/C with steady gait. Husband with pt on D/C.

## 2021-08-15 NOTE — ED Provider Notes (Signed)
[facility] Provider Note    Event Date/Time   First MD Initiated Contact with Patient 08/15/21 [phone]     (approximate)   History   Urinary Frequency   HPI  Kayla Spencer is a 71 y.o. female presents emergency department with dysuria that started overnight.  Patient had some vaginal bleeding which was being assessed by her physician.  Has already had a biopsy and they placed her on progesterone.  Patient states she has a history of UTIs and they do come on suddenly.  No fever or chills.  Patient has history of breast cancer.      Physical Exam   Triage Vital Signs: ED Triage Vitals [08/15/21 0923]  Enc Vitals Group     BP (!) 155/95     Pulse Rate (!) 116     Resp 17     Temp 98.4 F (36.9 C)     Temp Source Oral     SpO2 96 %     Weight      Height 5\' 4"  (1.626 m)     Head Circumference      Peak Flow      Pain Score 10     Pain Loc      Pain Edu?      Excl. in [place]?     Most recent vital signs: Vitals:   08/15/21 0923  BP: (!) 155/95  Pulse: (!) 116  Resp: 17  Temp: 98.4 F (36.9 C)  SpO2: 96%     General: Awake, no distress.   CV:  Good peripheral perfusion.   Resp:  Normal effort.   Abd:  No distention.  Mildly tender at suprapubic area Other:  Neuro is intact   ED Results / Procedures / Treatments   Labs (all labs ordered are listed, but only abnormal results are displayed) Labs Reviewed  URINALYSIS, ROUTINE W REFLEX MICROSCOPIC - Abnormal; Notable for the following components:      Result Value   Color, Urine AMBER (*)    APPearance CLOUDY (*)    Hgb urine dipstick LARGE (*)    Protein, ur 100 (*)    Leukocytes,Ua LARGE (*)    RBC / HPF >50 (*)    WBC, UA >50 (*)    All other components within normal limits     EKG     RADIOLOGY     PROCEDURES:  Critical Care performed: No  Procedures   MEDICATIONS ORDERED IN ED: Medications - No data to display   IMPRESSION / MDM / [place] /  ED COURSE  I reviewed the triage vital signs and the nursing notes.                              Differential diagnosis includes, but is not limited to, UTI, dysuria, cystitis, medication reaction  Since the patient is already being evaluated by her physician for the vaginal bleeding, will defer diagnosis and care of that to her doctor.  For the dysuria we will do a UA to see if there is any infection  UA shows large amount of leuks, greater than 50 RBCs greater than 50 WBCs.  On review the patient's past medical history, her last urine culture was in September and was not resistant to any antibiotics.  Patient prefers Macrobid as she states this helps usually more than other antibiotics.  Called in a prescription for Macrobid.  She  is to follow-up with Dr. Leafy Ro.  Follow-up with her regular doctor as needed.  Return if worsening.  She is discharged stable condition.       FINAL CLINICAL IMPRESSION(S) / ED DIAGNOSES   Final diagnoses:  Acute UTI     Rx / DC Orders   ED Discharge Orders          Ordered    nitrofurantoin, macrocrystal-monohydrate, (MACROBID) 100 MG capsule  2 times daily        08/15/21 1044             Note:  This document was prepared using Dragon voice recognition software and may include unintentional dictation errors.    Versie Starks, PA-C 08/15/21 1045    Vladimir Crofts, MD 08/15/21 1426

## 2021-08-15 NOTE — ED Notes (Signed)
See triage note.   Pt educated on the need for UA, pt states she will try and to produce a UA at this time.

## 2021-08-15 NOTE — ED Triage Notes (Signed)
Pt to ER via POV with complaints of urinary frequency and dysuria that started last night. No known fevers. Denies flank pain.  Reports hx of frequent UTIs.

## 2021-08-20 NOTE — H&P (Addendum)
Kayla Spencer is a 71 y.o. female here for Vaginal Bleeding (Prior ccw pt, started Monday morning, wearing a pantyliner and is getting heavier and having cramping - stopped tamoxifen in 2018) . History of Present Illness: Patient presents for evaluation of postmenopausal bleeding:    Date of last period: years ago, 71 yo  Bleeding started:  08/05/2021 Contributing factors:  Unknown  Kayla Spencer of the bleeding (pattern, quantity, duration, postcoital):  First incident of PMB. She woke up Monday morning and had bleeding that has increased in heaviness.  Associated sx (pain, fever, changes in bowel or bladder):  no Medications (anticoagulants, hormones):  no Supplements (soy, herbs, dietary):  no Family hx of breast, colon or endometrial cancer:  Personal hx of breast cancer, Maternal aunt with uterine cancer Pap smear hx: No abnormal pap smears, last pap 12/2018 neg/neg   Prior workup: for endometrial mass seen incidentally on CT scan    06/2017 EMBx  Atrophic endometrium  No hyperplasia or carcinoma     06/2017 SIS Saline [place] Ut anteflexed Complex endo=11.8m; hypoechoic complex area seen within endo ? 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  Past Surgical History:  has a past surgical history that includes colonoscopy (12/01/2011); egd (10/26/2009); Tonsillectomy and adenoidectomy (1958); Bunionectomy (2000); Left  breast lumpectomy (2012); cholecystectomy (05/2013); Colonoscopy (05/11/2017); and Cholecystectomy. Family History: family history includes Colon cancer in her paternal grandmother; High blood pressure (Hypertension) in her maternal grandfather; Myocardial Infarction (Heart attack) in her paternal grandfather; Osteoporosis (Thinning of bones) in her mother; Stroke in her maternal grandfather. Social History:  reports that she has never smoked. She has never used smokeless tobacco. She reports current alcohol use. She reports that she does not use drugs. OB/GYN History:  OB History     Gravida 2   Para 2   Term 2   Preterm     AB     Living 2     SAB     IAB     Ectopic     Molar     Multiple     Live Births 2          Allergies: is allergic to simvastatin and sulfa (sulfonamide antibiotics). Medications: Current Outpatient Medications:    amLODIPine (NORVASC) 2.5 MG tablet, TAKE 1 TABLET ONCE DAILY, Disp: 90 tablet, Rfl: 1   cholestyramine-aspartame (PREVALITE) 4 gram oral powder packet, Take 1 packet (4 g total) by mouth once daily Mix dose in 60-180 mL of water, milk or juice., Disp: 90 packet, Rfl: 3   cyanocobalamin (VITAMIN B12) 1000 MCG tablet, Take 1,000 mcg by mouth once daily., Disp: , Rfl:    esomeprazole (NEXIUM) 20 MG DR capsule, Take 20 mg by mouth once daily, Disp: , Rfl:    hydrOXYzine (ATARAX) 25 MG tablet, Take 1 tablet (25 mg total) by mouth nightly as needed for Itching, Disp: 90 tablet, Rfl: 1   metroNIDAZOLE (METROGEL) 1 % gel, ,  Disp: , Rfl:    nystatin-triamcinolone cream, , Disp: , Rfl:    tretinoin (RETIN-A) 0.05 % cream, APPLY TO AFFECTED AREA AS DIRECTED AT BEDTIME, Disp: , Rfl:    medroxyPROGESTERone (PROVERA) 10 MG tablet, Take 1 tablet (10 mg total) by mouth once daily for 5 days, Disp: 5 tablet, Rfl: 0     Review of Systems: See HPI    Exam:   BP (!) 131/90    Pulse 106    Ht 160 cm (_0 )    Wt 74.8 kg (165 lb)    BMI  29.23 kg/m  Body mass index is 29.23 kg/m.     Constitutional:  General appearance: Well nourished, well developed female in no acute distress.  Neuro/psych:  Normal mood and affect. No gross motor deficits. Neck:  Supple, normal appearance.  Respiratory:  Normal respiratory effort. CTAB Cardiovascular:  No lower extremity edema. RRR  Skin:  No rashes, ulcers, or skin lesions noted. No excessive hirsutism or acne noted. Warm and dry.  Abdomen: soft , no mass, non-tender, no rebound tenderness. Pelvic:              External genitalia: vulva /labia no lesions, Tanner stage 5             Urethra: no prolapse             Vagina: blood in vault, normal physiologic d/c             Cervix: no lesions, no cervical motion tenderness               Uterus: normal size shape and contour, non-tender             Adnexa: no mass,  non-tender               Rectovaginal: external exam normal   Endometrial biopsy:  SCANT ATROPHIC ENDOMETRIUM.  NO HYPERPLASIA OR CARCINOMA.  Impression:   The primary encounter diagnosis was Post-menopausal bleeding. A diagnosis of Personal history of breast cancer was also pertinent to this visit.   Plan:   1. Postmenopausal bleeding:  -Risks of surgery were discussed with the patient including but not limited to: bleeding which may require transfusion; infection which may require antibiotics; injury to uterus or surrounding organs; intrauterine scarring which may impair future fertility; need for additional procedures including laparotomy or laparoscopy; and other postoperative/anesthesia complications. Written informed consent was obtained.  This is a scheduled same-day surgery. She will have a postop visit in 2 weeks to review operative findings and pathology.

## 2021-08-22 ENCOUNTER — Other Ambulatory Visit: Payer: Self-pay

## 2021-08-22 ENCOUNTER — Other Ambulatory Visit
Admission: RE | Admit: 2021-08-22 | Discharge: 2021-08-22 | Disposition: A | Discharge status: Home / Self Care | Payer: Medicare Other | Source: Ambulatory Visit | Attending: Obstetrics and Gynecology | Admitting: Obstetrics and Gynecology

## 2021-08-22 DIAGNOSIS — N950 Postmenopausal bleeding: Secondary | ICD-10-CM | POA: Insufficient documentation

## 2021-08-22 DIAGNOSIS — I10 Essential (primary) hypertension: Secondary | ICD-10-CM | POA: Insufficient documentation

## 2021-08-22 DIAGNOSIS — Z01818 Encounter for other preprocedural examination: Secondary | ICD-10-CM | POA: Diagnosis not present

## 2021-08-22 HISTORY — DX: Essential (primary) hypertension: I10

## 2021-08-22 LAB — CBC
HCT: 40.7 % (ref 36.0–46.0)
Hemoglobin: 13.5 g/dL (ref 12.0–15.0)
MCH: 27.9 pg (ref 26.0–34.0)
MCHC: 33.2 g/dL (ref 30.0–36.0)
MCV: 84.1 fL (ref 80.0–100.0)
Platelets: 300 10*3/uL (ref 150–400)
RBC: 4.84 MIL/uL (ref 3.87–5.11)
RDW: 14.4 % (ref 11.5–15.5)
WBC: 8 10*3/uL (ref 4.0–10.5)
nRBC: 0 % (ref 0.0–0.2)

## 2021-08-22 LAB — BASIC METABOLIC PANEL
Anion gap: 9 (ref 5–15)
BUN: 11 mg/dL (ref 8–23)
CO2: 23 mmol/L (ref 22–32)
Calcium: 9.1 mg/dL (ref 8.9–10.3)
Chloride: 105 mmol/L (ref 98–111)
Creatinine, Ser: 0.68 mg/dL (ref 0.44–1.00)
GFR, Estimated: >60 mL/min (ref >60)
Glucose, Bld: 94 mg/dL (ref 70–99)
Potassium: 3.6 mmol/L (ref 3.5–5.1)
Sodium: 137 mmol/L (ref 135–145)

## 2021-08-22 LAB — TYPE AND SCREEN
ABO/RH(D): B POS
Antibody Screen: NEGATIVE

## 2021-08-22 NOTE — Patient Instructions (Signed)
Your procedure is scheduled on: Monday August 26, 2021. Report to Day Surgery inside [facility] 2nd floor.  To find out your arrival time please call [phone] between 1PM - 3PM on Friday August 23, 2021.  Remember: Instructions that are not followed completely may result in serious medical risk,  up to and including death, or upon the discretion of your surgeon and anesthesiologist your  surgery may need to be rescheduled.     _X__ 1. Do not eat food after midnight the night before your procedure.                 No chewing gum or hard candies. You may drink clear liquids up to 2 hours                 before you are scheduled to arrive for your surgery- DO not drink clear                 liquids within 2 hours of the start of your surgery.                 Clear Liquids include:  water, apple juice without pulp, clear Gatorade, G2 or                  Gatorade Zero (avoid Red/Purple/Blue), Black Coffee or Tea (Do not add                 anything to coffee or tea).  __X__2.  On the morning of surgery brush your teeth with toothpaste and water, you                may rinse your mouth with mouthwash if you wish.  Do not swallow any toothpaste or mouthwash.     _X__ 3.  No Alcohol for 24 hours before or after surgery.   _X__ 4.  Do Not Smoke or use e-cigarettes For 24 Hours Prior to Your Surgery.                 Do not use any chewable tobacco products for at least 6 hours prior to                 Surgery.  _X__  5.  Do not use any recreational drugs (marijuana, cocaine, heroin, ecstasy, MDMA or other)                For at least one week prior to your surgery.  Combination of these drugs with anesthesia                May have life threatening results.  ____  6.  Bring all medications with you on the day of surgery if instructed.   __X__7.  Notify your doctor if there is any change in your medical condition      (cold, fever, infections).     Do not wear  jewelry, make-up, hairpins, clips or nail polish. Do not wear lotions, powders, or perfumes. You may wear deodorant. Do not shave 48 hours prior to surgery.  Do not bring valuables to the hospital.    [facility] is not responsible for any belongings or valuables.  Contacts, dentures or bridgework may not be worn into surgery. Leave your suitcase in the car. After surgery it may be brought to your room. For patients admitted to the hospital, discharge time is determined by your treatment team.   Patients discharged the day of surgery will not  be allowed to drive home.   Make arrangements for someone to be with you for the first 24 hours of your Same Day Discharge.   __X__ Take these medicines the morning of surgery with A SIP OF WATER:    1. amLODipine (NORVASC) 2.5 MG  2. esomeprazole (NEXIUM) 20 MG  3.   4.  5.  6.  ____ Fleet Enema (as directed)   ____ Use CHG Soap (or wipes) as directed  ____ Use Benzoyl Peroxide Gel as instructed  ____ Use inhalers on the day of surgery  ____ Stop metformin 2 days prior to surgery    ____ Take 1/2 of usual insulin dose the night before surgery. No insulin the morning          of surgery.   ____ Call your PCP, cardiologist, or Pulmonologist if taking Coumadin/Plavix/aspirin and ask when to stop before your surgery.   __X__ One Week prior to surgery- Stop Anti-inflammatories such as Ibuprofen, Aleve, Advil, Motrin, meloxicam (MOBIC), diclofenac, etodolac, ketorolac, Toradol, Daypro, piroxicam, Goody's or BC powders. OK TO USE TYLENOL IF NEEDED   _X___ Stop supplements until after surgery. Biotin    ____ Bring C-Pap to the hospital.    If you have any questions regarding your pre-procedure instructions,  Please call Pre-admit Testing at [phone]

## 2021-08-26 ENCOUNTER — Ambulatory Visit
Admission: RE | Admit: 2021-08-26 | Discharge: 2021-08-26 | Disposition: A | Discharge status: Home / Self Care | Payer: Medicare Other | Attending: Obstetrics and Gynecology | Admitting: Obstetrics and Gynecology

## 2021-08-26 ENCOUNTER — Other Ambulatory Visit: Payer: Self-pay

## 2021-08-26 ENCOUNTER — Encounter: Payer: Self-pay | Admitting: Obstetrics and Gynecology

## 2021-08-26 ENCOUNTER — Ambulatory Visit: Payer: Medicare Other | Admitting: Anesthesiology

## 2021-08-26 ENCOUNTER — Encounter
Admission: RE | Disposition: A | Discharge status: Home / Self Care | Payer: Self-pay | Source: Home / Self Care | Attending: Obstetrics and Gynecology

## 2021-08-26 DIAGNOSIS — Z853 Personal history of malignant neoplasm of breast: Secondary | ICD-10-CM | POA: Insufficient documentation

## 2021-08-26 DIAGNOSIS — I10 Essential (primary) hypertension: Secondary | ICD-10-CM | POA: Diagnosis not present

## 2021-08-26 DIAGNOSIS — N840 Polyp of corpus uteri: Secondary | ICD-10-CM | POA: Insufficient documentation

## 2021-08-26 DIAGNOSIS — Z803 Family history of malignant neoplasm of breast: Secondary | ICD-10-CM | POA: Diagnosis not present

## 2021-08-26 DIAGNOSIS — N950 Postmenopausal bleeding: Secondary | ICD-10-CM | POA: Diagnosis present

## 2021-08-26 DIAGNOSIS — Z8049 Family history of malignant neoplasm of other genital organs: Secondary | ICD-10-CM | POA: Diagnosis not present

## 2021-08-26 HISTORY — PX: POLYPECTOMY: SHX5525

## 2021-08-26 HISTORY — PX: HYSTEROSCOPY WITH D & C: SHX1775

## 2021-08-26 LAB — ABO/RH: ABO/RH(D): B POS

## 2021-08-26 SURGERY — DILATATION AND CURETTAGE /HYSTEROSCOPY
Anesthesia: General | Site: Vagina

## 2021-08-26 MED ORDER — DEXAMETHASONE SODIUM PHOSPHATE 10 MG/ML IJ SOLN
INTRAMUSCULAR | Status: DC | PRN
  Administered 2021-08-26: 10 mg via INTRAVENOUS

## 2021-08-26 MED ORDER — CHLORHEXIDINE GLUCONATE 0.12 % MT SOLN
OROMUCOSAL | Status: AC
  Administered 2021-08-26: 15 mL via OROMUCOSAL
  Filled 2021-08-26: qty 15

## 2021-08-26 MED ORDER — SODIUM CHLORIDE 0.9 % IR SOLN
Status: DC | PRN
  Administered 2021-08-26: 3000 mL

## 2021-08-26 MED ORDER — LACTATED RINGERS IV SOLN: INTRAVENOUS | Status: DC

## 2021-08-26 MED ORDER — SILVER NITRATE-POT NITRATE 75-25 % EX MISC
CUTANEOUS | Status: AC
  Filled 2021-08-26: qty 10

## 2021-08-26 MED ORDER — PHENYLEPHRINE 40 MCG/ML (10ML) SYRINGE FOR IV PUSH (FOR BLOOD PRESSURE SUPPORT)
PREFILLED_SYRINGE | INTRAVENOUS | Status: DC | PRN
  Administered 2021-08-26 (×5): 80 ug via INTRAVENOUS

## 2021-08-26 MED ORDER — KETOROLAC TROMETHAMINE 15 MG/ML IJ SOLN
15.0000 mg | Freq: Once | INTRAMUSCULAR | Status: AC
  Administered 2021-08-26: 15 mg via INTRAVENOUS

## 2021-08-26 MED ORDER — ONDANSETRON HCL 4 MG/2ML IJ SOLN
INTRAMUSCULAR | Status: DC | PRN
  Administered 2021-08-26: 4 mg via INTRAVENOUS

## 2021-08-26 MED ORDER — FENTANYL CITRATE (PF) 100 MCG/2ML IJ SOLN
INTRAMUSCULAR | Status: AC
  Filled 2021-08-26: qty 2

## 2021-08-26 MED ORDER — ORAL CARE MOUTH RINSE: 15.0000 mL | Freq: Once | OROMUCOSAL | Status: AC

## 2021-08-26 MED ORDER — FENTANYL CITRATE (PF) 100 MCG/2ML IJ SOLN
25.0000 ug | INTRAMUSCULAR | Status: DC | PRN
  Administered 2021-08-26 (×2): 50 ug via INTRAVENOUS

## 2021-08-26 MED ORDER — POVIDONE-IODINE 10 % EX SWAB: 2.0000 "application " | Freq: Once | CUTANEOUS | Status: DC

## 2021-08-26 MED ORDER — ONDANSETRON HCL 4 MG/2ML IJ SOLN: 4.0000 mg | Freq: Once | INTRAMUSCULAR | Status: DC | PRN

## 2021-08-26 MED ORDER — ACETAMINOPHEN 10 MG/ML IV SOLN
INTRAVENOUS | Status: DC | PRN
  Administered 2021-08-26: 1000 mg via INTRAVENOUS

## 2021-08-26 MED ORDER — PROPOFOL 10 MG/ML IV BOLUS
INTRAVENOUS | Status: DC | PRN
  Administered 2021-08-26: 120 mg via INTRAVENOUS

## 2021-08-26 MED ORDER — ACETAMINOPHEN 10 MG/ML IV SOLN
INTRAVENOUS | Status: AC
  Filled 2021-08-26: qty 100

## 2021-08-26 MED ORDER — PROPOFOL 10 MG/ML IV BOLUS
INTRAVENOUS | Status: AC
  Filled 2021-08-26: qty 20

## 2021-08-26 MED ORDER — CHLORHEXIDINE GLUCONATE 0.12 % MT SOLN: 15.0000 mL | Freq: Once | OROMUCOSAL | Status: AC

## 2021-08-26 MED ORDER — 0.9 % SODIUM CHLORIDE (POUR BTL) OPTIME
TOPICAL | Status: DC | PRN
  Administered 2021-08-26: 500 mL

## 2021-08-26 MED ORDER — FENTANYL CITRATE (PF) 100 MCG/2ML IJ SOLN
INTRAMUSCULAR | Status: DC | PRN
  Administered 2021-08-26 (×4): 25 ug via INTRAVENOUS

## 2021-08-26 MED ORDER — KETOROLAC TROMETHAMINE 15 MG/ML IJ SOLN
INTRAMUSCULAR | Status: AC
  Filled 2021-08-26: qty 1

## 2021-08-26 MED ORDER — LIDOCAINE HCL (CARDIAC) PF 100 MG/5ML IV SOSY
PREFILLED_SYRINGE | INTRAVENOUS | Status: DC | PRN
  Administered 2021-08-26: 70 mg via INTRAVENOUS

## 2021-08-26 SURGICAL SUPPLY — 24 items
BAG INFUSER PRESSURE 100CC (MISCELLANEOUS) ×4 IMPLANT
DEVICE MYOSURE REACH (MISCELLANEOUS) ×2 IMPLANT
DRSG TELFA 3X8 NADH (GAUZE/BANDAGES/DRESSINGS) ×4 IMPLANT
ELECT REM PT RETURN 9FT ADLT (ELECTROSURGICAL) ×4
ELECTRODE REM PT RTRN 9FT ADLT (ELECTROSURGICAL) ×2 IMPLANT
GAUZE 4X4 16PLY ~~LOC~~+RFID DBL (SPONGE) ×8 IMPLANT
GLOVE SURG ENC MOIS LTX SZ7 (GLOVE) ×4 IMPLANT
GLOVE SURG UNDER LTX SZ7.5 (GLOVE) ×4 IMPLANT
GOWN STRL REUS W/ TWL LRG LVL3 (GOWN DISPOSABLE) ×4 IMPLANT
GOWN STRL REUS W/TWL LRG LVL3 (GOWN DISPOSABLE) ×4
IV NS IRRIG 3000ML ARTHROMATIC (IV SOLUTION) ×4 IMPLANT
KIT PROCEDURE FLUENT (KITS) ×4 IMPLANT
KIT TURNOVER CYSTO (KITS) ×4 IMPLANT
MANIFOLD NEPTUNE II (INSTRUMENTS) ×4 IMPLANT
PACK DNC HYST (MISCELLANEOUS) ×4 IMPLANT
PAD DRESSING TELFA 3X8 NADH (GAUZE/BANDAGES/DRESSINGS) IMPLANT
PAD OB MATERNITY 4.3X12.25 (PERSONAL CARE ITEMS) ×4 IMPLANT
PAD PREP 24X41 OB/GYN DISP (PERSONAL CARE ITEMS) ×4 IMPLANT
SCRUB EXIDINE 4% CHG 4OZ (MISCELLANEOUS) ×4 IMPLANT
SEAL ROD LENS SCOPE MYOSURE (ABLATOR) ×4 IMPLANT
SET CYSTO W/LG BORE CLAMP LF (SET/KITS/TRAYS/PACK) IMPLANT
TUBING CONNECTING 10 (TUBING) ×3 IMPLANT
TUBING CONNECTING 10' (TUBING) ×1
WATER STERILE IRR 500ML POUR (IV SOLUTION) ×4 IMPLANT

## 2021-08-26 NOTE — Transfer of Care (Signed)
Immediate Anesthesia Transfer of Care Note  Patient: Kayla Spencer  Procedure(s) Performed: FRACTIONAL DILATATION AND CURETTAGE /HYSTEROSCOPY (Vagina ) POLYPECTOMY (Uterus)  Patient Location: PACU  Anesthesia Type:General  Level of Consciousness: awake, alert  and oriented  Airway & Oxygen Therapy: Patient Spontanous Breathing and Patient connected to face mask oxygen  Post-op Assessment: Report given to RN and Post -op Vital signs reviewed and stable  Post vital signs: Reviewed and stable  Last Vitals:  Vitals Value Taken Time  BP 150/83 08/26/21 1700  Temp 36.6 C 08/26/21 1700  Pulse 91 08/26/21 1704  Resp 10 08/26/21 1704  SpO2 100 % 08/26/21 1704  Vitals shown include unvalidated device data.  Last Pain:  Vitals:   08/26/21 1431  TempSrc: Temporal  PainSc: 0-No pain         Complications: No notable events documented.

## 2021-08-26 NOTE — Op Note (Signed)
Operative Report Hysteroscopy with Dilation and Curettage   Indications: Postmenopausal bleeding   Pre-operative Diagnosis: Endometrial lesion found on SIS   Post-operative Diagnosis: same.  Procedure: 1. Exam under anesthesia 2. Fractional D&C 3. Hysteroscopy 4. Myosure myomectomy  Surgeon: Benjaman Kindler, MD  Assistant(s):  None  Anesthesia: General LMA anesthesia  Anesthesiologist: Martha Clan, MD Anesthesiologist: Martha Clan, MD CRNA: Norm Salt, CRNA  Estimated Blood Loss:  less than 50 mL         Total IV Fluids: see anesthesia  Urine Output: 135ml  Total Fluid Deficit:  0 mL          Specimens: Endocervical curettings, endometrial curettings, myosure polypectomy fragments         Complications:  None; patient tolerated the procedure well.         Disposition: PACU - hemodynamically stable.         Condition: stable  Findings: Uterus measuring 8 cm by sound; normal cervix, vagina, perineum. Large anterior polypoid tissue, in two discrete polyps. Endometrial tissue that was less atrophic than proliferative on visualization. Both tubal ostia visualized.  Indication for procedure/Consents: 71 y.o. [id number]  here for scheduled surgery for the aforementioned diagnoses.  Risks of surgery were discussed with the patient including but not limited to: bleeding which may require transfusion; infection which may require antibiotics; injury to uterus or surrounding organs; intrauterine scarring which may impair future fertility; need for additional procedures including laparotomy or laparoscopy; and other postoperative/anesthesia complications. Written informed consent was obtained.    Procedure Details: D&C/ Myosure  The patient was taken to the operating room where anesthesia was administered and was found to be adequate.  After a formal and adequate timeout was performed, she was placed in the dorsal lithotomy position and examined with the above findings. She  was then prepped and draped in the sterile manner.   Her bladder was catheterized for an estimated amount of clear, yellow urine. A weighed speculum was then placed in the patient's vagina and a single tooth tenaculum was applied to the anterior lip of the cervix.  Her cervix was serially dilated to 15 [place] using Hanks dilators.  An ECC was performed. The hysteroscope was introduced under direct observation  Using lactated ringers as a distention medium to reveal the above findings. The uterine cavity was carefully examined, both ostia were recognized, and diffusely proliferative endometrium with the fibroid or polyp above were noted.   This was resected using the Myosure device.  After further careful visualization of the uterine cavity, the hysteroscope was removed under direct visualization.  A sharp curettage was then performed until there was a gritty texture in all four quadrants.  The tenaculum was removed from the anterior lip of the cervix and the vaginal speculum was removed after applying pressure for good hemostasis. The polyp closest to the cervix was very vascular, and bled significantly heavier than normal. No uterine perforation was present and no damage to the uterine wall was noted.  The patient tolerated the procedure well and was taken to the recovery area awake and in stable condition. She received iv acetaminophen and Toradol prior to leaving the OR.  The patient will be discharged to home as per PACU criteria. Routine postoperative instructions given.  She was prescribed Ibuprofen and Colace.  She will follow up in the clinic in two weeks for postoperative evaluation.

## 2021-08-26 NOTE — Anesthesia Preprocedure Evaluation (Signed)
Anesthesia Evaluation  Patient identified by MRN, date of birth, ID band Patient awake    Reviewed: Allergy & Precautions, NPO status , Patient's Chart, lab work & pertinent test results, reviewed documented beta blocker date and time   History of Anesthesia Complications Negative for: history of anesthetic complications  Airway Mallampati: II  TM Distance: >3 FB     Dental  (+) Dental Advidsory Given, Missing, Teeth Intact   Pulmonary neg pulmonary ROS,    Pulmonary exam normal        Cardiovascular Exercise Tolerance: Good hypertension, (-) angina(-) Past MI and (-) Cardiac Stents Normal cardiovascular exam(-) dysrhythmias (-) Valvular Problems/Murmurs     Neuro/Psych  Headaches, neg Seizures    GI/Hepatic Neg liver ROS, hiatal hernia, GERD  ,  Endo/Other  negative endocrine ROS  Renal/GU negative Renal ROS     Musculoskeletal  (+) Arthritis ,   Abdominal   Peds  Hematology   Anesthesia Other Findings Past Medical History: 2012: Breast cancer ([place])     Comment:  left breast No date: Chronic sinusitis No date: Dyspnea No date: GERD (gastroesophageal reflux disease) No date: Headache     Comment:  migraines No date: History of hiatal hernia No date: Hyperlipidemia No date: Hypertension No date: Osteoarthritis     Comment:  thumbs No date: Personal history of radiation therapy   Reproductive/Obstetrics                             Anesthesia Physical  Anesthesia Plan  ASA: 3  Anesthesia Plan: General   Post-op Pain Management:    Induction: Intravenous  PONV Risk Score and Plan: 3 and Ondansetron, Dexamethasone, Midazolam and Treatment may vary due to age or medical condition  Airway Management Planned: LMA  Additional Equipment:   Intra-op Plan:   Post-operative Plan: Extubation in OR  Informed Consent: I have reviewed the patients History and Physical, chart, labs  and discussed the procedure including the risks, benefits and alternatives for the proposed anesthesia with the patient or authorized representative who has indicated his/her understanding and acceptance.       Plan Discussed with: CRNA  Anesthesia Plan Comments:         Anesthesia Quick Evaluation

## 2021-08-26 NOTE — Anesthesia Preprocedure Evaluation (Deleted)
Anesthesia Evaluation  Patient identified by MRN, date of birth, ID band Patient awake    Reviewed: Allergy & Precautions, NPO status , Patient's Chart, lab work & pertinent test results  Airway Mallampati: III  TM Distance: >3 FB Neck ROM: full    Dental  (+) Chipped   Pulmonary shortness of breath,    Pulmonary exam normal        Cardiovascular hypertension, Pt. on medications Normal cardiovascular exam     Neuro/Psych negative neurological ROS  negative psych ROS   GI/Hepatic Neg liver ROS, hiatal hernia, GERD  ,  Endo/Other  negative endocrine ROS  Renal/GU negative Renal ROS  negative genitourinary   Musculoskeletal   Abdominal   Peds  Hematology negative hematology ROS (+)   Anesthesia Other Findings Past Medical History: 2012: Breast cancer ([place])     Comment:  left breast No date: Chronic sinusitis No date: Dyspnea No date: GERD (gastroesophageal reflux disease) No date: Headache     Comment:  migraines No date: History of hiatal hernia No date: Hyperlipidemia No date: Hypertension No date: Osteoarthritis     Comment:  thumbs No date: Personal history of radiation therapy  Past Surgical History: 2012: BREAST BIOPSY; Left     Comment:  positive 2012: BREAST BIOPSY; Left No date: BREAST BIOPSY; Left     Comment:  negative, age 23, microcalcifications No date: BREAST BIOPSY; Left     Comment:  negative  2012: BREAST LUMPECTOMY; Left 2012: BREAST LUMPECTOMY; Left No date: BREAST SURGERY     Comment:  lt lumpectomy x 2 No date: BUNIONECTOMY No date: CHOLECYSTECTOMY 05/11/2017: COLONOSCOPY WITH PROPOFOL; N/A     Comment:  Procedure: COLONOSCOPY WITH PROPOFOL;  Surgeon:               Lollie Sails, MD;  Location: [facility] ENDOSCOPY;                Service: Endoscopy;  Laterality: N/A; No date: FINGER ARTHROPLASTY     Comment:  thumb tendon replacement No date: TONSILLECTOMY AND  ADENOIDECTOMY  BMI    Body Mass Index: 26.61 kg/m      Reproductive/Obstetrics negative OB ROS                             Anesthesia Physical Anesthesia Plan  ASA: 2  Anesthesia Plan: General   Post-op Pain Management:    Induction: Intravenous  PONV Risk Score and Plan: Propofol infusion and TIVA  Airway Management Planned:   Additional Equipment:   Intra-op Plan:   Post-operative Plan:   Informed Consent:   Plan Discussed with: Anesthesiologist, CRNA and Surgeon  Anesthesia Plan Comments: (Patient consented for risks of anesthesia including but not limited to:  - adverse reactions to medications - risk of airway placement if required - damage to eyes, teeth, lips or other oral mucosa - nerve damage due to positioning  - sore throat or hoarseness - Damage to heart, brain, nerves, lungs, other parts of body or loss of life  Patient voiced understanding.)        Anesthesia Quick Evaluation

## 2021-08-26 NOTE — Discharge Instructions (Addendum)
Discharge instructions after a hysteroscopy with dilation and curettage  Signs and Symptoms to Report  Call our office at [phone] if you have any of the following:    Fever over 100.4 degrees or higher  Severe stomach pain not relieved with pain medications  Bright red bleeding thats heavier than a period that does not slow with rest after the first 24 hours. I anticipate that you will have vaginal bleeding that decreases into spotting for several weeks.  To go the bathroom a lot (frequency), you cant hold your urine (urgency), or it hurts when you empty your bladder (urinate)  Chest pain  Shortness of breath  Pain in the calves of your legs  Severe nausea and vomiting not relieved with anti-nausea medications  Any concerns  What You Can Expect after Surgery  You may see some pink tinged, bloody fluid. This is normal. You may also have cramping for several days.   Activities after Your Discharge Follow these guidelines to help speed your recovery at home:  Dont drive if you are in pain or taking narcotic pain medicine. You may drive when you can safely slam on the brakes, turn the wheel forcefully, and rotate your torso comfortably. This is typically 4-7 days. Practice in a parking lot or side street prior to attempting to drive regularly.   Ask others to help with household chores for 4 weeks.  Dont do strenuous activities, exercises, or sports like vacuuming, tennis, squash, etc. until your doctor says it is safe to do so.  Walk as you feel able. Rest often since it may take a week or two for your energy level to return to normal.   You may climb stairs  Avoid constipation:   -Eat fruits, vegetables, and whole grains. Eat small meals as your appetite will take time to return to normal.   -Drink 6 to 8 glasses of water each day unless your doctor has told you to limit your fluids.   -Use a laxative or stool softener as needed if constipation becomes a problem. You may take  Miralax, metamucil, Citrucil, Colace, [place], FiberCon, etc. If this does not relieve the constipation, try two tablespoons of Milk Of Magnesia every 8 hours until your bowels move.   You may shower.   Do not get in a hot tub, swimming pool, etc. until your doctor agrees.  Do not douche, use tampons, or have sex until your doctor says it is okay, usually about 2 weeks.  Take your pain medicine when you need it. The medicine may not work as well if the pain is bad.  Take the medicines you were taking before surgery. Other medications you might need are pain medications (ibuprofen), medications for constipation (Colace) and nausea medications (Zofran).   (You were given Tylenol 1,000 mg at 4:41 pm, next time you can have some is after 10:41 pm if needed for pain. NSAID, (Ibuprofen), was given to you at 5:05 pm, next dose is due after 11:05 pm if needed for pain.)   AMBULATORY SURGERY  DISCHARGE INSTRUCTIONS   The drugs that you were given will stay in your system until tomorrow so for the next 24 hours you should not:  Drive an automobile Make any legal decisions Drink any alcoholic beverage   You may resume regular meals tomorrow.  Today it is better to start with liquids and gradually work up to solid foods.  You may eat anything you prefer, but it is better to start with liquids,  then soup and crackers, and gradually work up to solid foods.   Please notify your doctor immediately if you have any unusual bleeding, trouble breathing, redness and pain at the surgery site, drainage, fever, or pain not relieved by medication.    Additional Instructions:        Please contact your physician with any problems or Same Day Surgery at [phone], Monday through Friday 6 am to 4 pm, or [place] at [facility] number at [phone].

## 2021-08-26 NOTE — Anesthesia Postprocedure Evaluation (Signed)
Anesthesia Post Note  Patient: Conservation officer, historic buildings  Procedure(s) Performed: FRACTIONAL DILATATION AND CURETTAGE /HYSTEROSCOPY (Vagina ) POLYPECTOMY (Uterus)  Patient location during evaluation: PACU Anesthesia Type: General Level of consciousness: awake and alert Pain management: pain level controlled Vital Signs Assessment: post-procedure vital signs reviewed and stable Respiratory status: spontaneous breathing, nonlabored ventilation, respiratory function stable and patient connected to nasal cannula oxygen Cardiovascular status: blood pressure returned to baseline and stable Postop Assessment: no apparent nausea or vomiting Anesthetic complications: no   No notable events documented.   Last Vitals:  Vitals:   08/26/21 1745 08/26/21 1805  BP:  (!) 160/94  Pulse:  82  Resp: 14 16  Temp: 36.5 C 36.6 C  SpO2: 96% 95%    Last Pain:  Vitals:   08/26/21 1805  TempSrc: Temporal  PainSc: 4                  Martha Clan

## 2021-08-26 NOTE — Interval H&P Note (Signed)
History and Physical Interval Note:  08/26/2021 4:00 PM  Kayla Spencer  has presented today for surgery, with the diagnosis of post menopausal bleeding.  The various methods of treatment have been discussed with the patient and family. After consideration of risks, benefits and other options for treatment, the patient has consented to  Procedure(s): [place] /HYSTEROSCOPY (N/A) as a surgical intervention.  The patient's history has been reviewed, patient examined, no change in status, stable for surgery.  I have reviewed the patient's chart and labs.  Questions were answered to the patient's satisfaction.     Benjaman Kindler

## 2021-08-26 NOTE — Anesthesia Procedure Notes (Signed)
Procedure Name: LMA Insertion Date/Time: 08/26/2021 4:19 PM Performed by: Lily Peer, Bayler Nehring, CRNA Pre-anesthesia Checklist: Patient identified, Emergency Drugs available, Suction available and Patient being monitored Patient Re-evaluated:Patient Re-evaluated prior to induction Oxygen Delivery Method: Circle system utilized Preoxygenation: Pre-oxygenation with 100% oxygen Induction Type: IV induction Ventilation: Mask ventilation without difficulty LMA: LMA inserted LMA Size: 4.0 Number of attempts: 1 Placement Confirmation: positive ETCO2 and breath sounds checked- equal and bilateral Tube secured with: Tape Dental Injury: Teeth and Oropharynx as per pre-operative assessment

## 2021-08-27 ENCOUNTER — Encounter: Payer: Self-pay | Admitting: Obstetrics and Gynecology

## 2021-08-28 LAB — SURGICAL PATHOLOGY

## 2021-11-11 ENCOUNTER — Other Ambulatory Visit: Payer: Self-pay | Admitting: Obstetrics and Gynecology

## 2021-11-11 DIAGNOSIS — Z1231 Encounter for screening mammogram for malignant neoplasm of breast: Secondary | ICD-10-CM

## 2022-01-20 ENCOUNTER — Ambulatory Visit
Admission: RE | Admit: 2022-01-20 | Discharge: 2022-01-20 | Disposition: A | Discharge status: Home / Self Care | Payer: Medicare Other | Source: Ambulatory Visit | Attending: Obstetrics and Gynecology | Admitting: Obstetrics and Gynecology

## 2022-01-20 DIAGNOSIS — Z1231 Encounter for screening mammogram for malignant neoplasm of breast: Secondary | ICD-10-CM | POA: Insufficient documentation

## 2022-03-25 ENCOUNTER — Ambulatory Visit (INDEPENDENT_AMBULATORY_CARE_PROVIDER_SITE_OTHER): Payer: Medicare Other | Admitting: Dermatology

## 2022-03-25 DIAGNOSIS — Z1283 Encounter for screening for malignant neoplasm of skin: Secondary | ICD-10-CM

## 2022-03-25 DIAGNOSIS — K130 Diseases of lips: Secondary | ICD-10-CM

## 2022-03-25 DIAGNOSIS — L818 Other specified disorders of pigmentation: Secondary | ICD-10-CM

## 2022-03-25 DIAGNOSIS — L821 Other seborrheic keratosis: Secondary | ICD-10-CM

## 2022-03-25 DIAGNOSIS — L719 Rosacea, unspecified: Secondary | ICD-10-CM | POA: Diagnosis not present

## 2022-03-25 DIAGNOSIS — L304 Erythema intertrigo: Secondary | ICD-10-CM | POA: Diagnosis not present

## 2022-03-25 DIAGNOSIS — D2362 Other benign neoplasm of skin of left upper limb, including shoulder: Secondary | ICD-10-CM

## 2022-03-25 DIAGNOSIS — L814 Other melanin hyperpigmentation: Secondary | ICD-10-CM

## 2022-03-25 DIAGNOSIS — L578 Other skin changes due to chronic exposure to nonionizing radiation: Secondary | ICD-10-CM

## 2022-03-25 DIAGNOSIS — D1800 Hemangioma unspecified site: Secondary | ICD-10-CM

## 2022-03-25 DIAGNOSIS — H01119 Allergic dermatitis of unspecified eye, unspecified eyelid: Secondary | ICD-10-CM

## 2022-03-25 DIAGNOSIS — L603 Nail dystrophy: Secondary | ICD-10-CM

## 2022-03-25 DIAGNOSIS — L700 Acne vulgaris: Secondary | ICD-10-CM

## 2022-03-25 DIAGNOSIS — D2371 Other benign neoplasm of skin of right lower limb, including hip: Secondary | ICD-10-CM

## 2022-03-25 DIAGNOSIS — H01111 Allergic dermatitis of right upper eyelid: Secondary | ICD-10-CM

## 2022-03-25 MED ORDER — NYSTATIN 100000 UNIT/GM EX POWD: CUTANEOUS | 1 refills | Status: AC

## 2022-03-25 MED ORDER — TRETINOIN 0.05 % EX CREA: TOPICAL_CREAM | CUTANEOUS | 1 refills | Status: DC

## 2022-03-25 MED ORDER — METRONIDAZOLE 1 % EX CREA: TOPICAL_CREAM | Freq: Every day | CUTANEOUS | 1 refills | Status: AC

## 2022-03-25 NOTE — Patient Instructions (Addendum)
Zeasorb AF Powder or Nystop Powder - Apply to skin fold every morning.  Ketoconazole 2% Cream - Apply to corner of mouth twice a day until rash improved. May also use on upper eyelid until rash improved.  Eucrisa Ointment - Apply to rash on upper eyelid twice a day until improved.   Continue metronidazole 1% cream and tretinoin 0.05% cream - apply each to face daily for rosacea.  DermaNail and cutemol - Apply to fingernails twice a day.  Biotin - 2.'5mg'$  daily  Due to recent changes in healthcare laws, you may see results of your pathology and/or laboratory studies on MyChart before the doctors have had a chance to review them. We understand that in some cases there may be results that are confusing or concerning to you. Please understand that not all results are received at the same time and often the doctors may need to interpret multiple results in order to provide you with the best plan of care or course of treatment. Therefore, we ask that you please give [place] 2 business days to thoroughly review all your results before contacting the office for clarification. Should we see a critical lab result, you will be contacted sooner.   If You Need Anything After Your Visit  If you have any questions or concerns for your doctor, please call our main line at [phone] and press option 4 to reach your doctor's medical assistant. If no one answers, please leave a voicemail as directed and we will return your call as soon as possible. Messages left after 4 pm will be answered the following business day.   You may also send [place] a message via [place]. We typically respond to MyChart messages within 1-2 business days.  For prescription refills, please ask your pharmacy to contact our office. Our fax number is [phone].  If you have an urgent issue when the clinic is closed that cannot wait until the next business day, you can page your doctor at the number below.    Please note that while we do our  best to be available for urgent issues outside of office hours, we are not available 24/7.   If you have an urgent issue and are unable to reach [place], you may choose to seek medical care at your doctor's office, retail clinic, urgent care center, or emergency room.  If you have a medical emergency, please immediately call 911 or go to the emergency department.  Pager Numbers  - Dr. Nehemiah Massed: [phone]  - Dr. Laurence Ferrari: [phone]  - Dr. Nicole Kindred: [phone]  In the event of inclement weather, please call our main line at [phone] for an update on the status of any delays or closures.  Dermatology Medication Tips: Please keep the boxes that topical medications come in in order to help keep track of the instructions about where and how to use these. Pharmacies typically print the medication instructions only on the boxes and not directly on the medication tubes.   If your medication is too expensive, please contact our office at [phone] option 4 or send [place] a message through [place].   We are unable to tell what your co-pay for medications will be in advance as this is different depending on your insurance coverage. However, we may be able to find a substitute medication at lower cost or fill out paperwork to get insurance to cover a needed medication.   If a prior authorization is required to get your medication covered by your insurance company, please allow  [place] 1-2 business days to complete this process.  Drug prices often vary depending on where the prescription is filled and some pharmacies may offer cheaper prices.  The website www.goodrx.com contains coupons for medications through different pharmacies. The prices here do not account for what the cost may be with help from insurance (it may be cheaper with your insurance), but the website can give you the price if you did not use any insurance.  - You can print the associated coupon and take it with your prescription to the  pharmacy.  - You may also stop by our office during regular business hours and pick up a GoodRx coupon card.  - If you need your prescription sent electronically to a different pharmacy, notify our office through [facility] or by phone at [phone] option 4.     Si Usted Necesita Algo Despus de Su Visita  Tambin puede enviarnos un mensaje a travs de Pharmacist, community. Por lo general respondemos a los mensajes de MyChart en el transcurso de 1 a 2 das hbiles.  Para renovar recetas, por favor pida a su farmacia que se ponga en contacto con nuestra oficina. Harland Dingwall de fax es [place] [phone].  Si tiene un asunto urgente cuando la clnica est cerrada y que no puede esperar hasta el siguiente da hbil, puede llamar/localizar a su doctor(a) al nmero que aparece a continuacin.   Por favor, tenga en cuenta que aunque hacemos todo lo posible para estar disponibles para asuntos urgentes fuera del horario de [place], no estamos disponibles las 24 horas del da, los 7 das de la [place].   Si tiene un problema urgente y no puede comunicarse con nosotros, puede optar por buscar atencin mdica  en el consultorio de su doctor(a), en una clnica privada, en un centro de atencin urgente o en una sala de emergencias.  Si tiene Engineering geologist, por favor llame inmediatamente al 911 o vaya a la sala de emergencias.  Nmeros de bper  - Dr. Nehemiah Massed: [phone]  - Dra. Moye: [phone]  - Dra. Nicole Kindred: [phone]  En caso de inclemencias del [place], por favor llame a Johnsie Kindred principal al [phone] para una actualizacin sobre el [place] de cualquier retraso o cierre.  Consejos para la medicacin en dermatologa: Por favor, guarde las cajas en las que vienen los medicamentos de uso tpico para ayudarle a seguir las instrucciones sobre dnde y cmo usarlos. Las farmacias generalmente imprimen las instrucciones del medicamento slo en las cajas y no directamente en los  tubos del [place].   Si su medicamento es muy caro, por favor, pngase en contacto con Zigmund Daniel llamando al [phone] y presione la opcin 4 o envenos un mensaje a travs de Pharmacist, community.   No podemos decirle cul ser su copago por los medicamentos por adelantado ya que esto es diferente dependiendo de la cobertura de su seguro. Sin embargo, es posible que podamos encontrar un medicamento sustituto a Electrical engineer un formulario para que el seguro cubra el medicamento que se considera necesario.   Si se requiere una autorizacin previa para que su compaa de seguros [place] su medicamento, por favor permtanos de 1 a 2 das hbiles para completar este proceso.  Los precios de los medicamentos varan con frecuencia dependiendo del Environmental consultant de dnde se surte la receta y alguna farmacias pueden ofrecer precios ms baratos.  El sitio web www.goodrx.com tiene cupones para medicamentos de Airline pilot. Los precios aqu no tienen en cuenta lo que podra  costar con la ayuda del seguro (puede ser ms barato con su seguro), pero el sitio web puede darle el precio si no Field seismologist.  - Puede imprimir el cupn correspondiente y llevarlo con su receta a la farmacia.  - Tambin puede pasar por nuestra oficina durante el horario de atencin regular y Charity fundraiser una tarjeta de cupones de GoodRx.  - Si necesita que su receta se enve electrnicamente a una farmacia diferente, informe a nuestra oficina a travs de MyChart de  o por telfono llamando al [phone] y presione la opcin 4.

## 2022-03-25 NOTE — Progress Notes (Signed)
Follow-Up Visit   Subjective  Kayla Spencer is a 71 y.o. female who presents for the following: Annual Exam.  The patient presents for Total-Body Skin Exam (TBSE) for skin cancer screening and mole check.  The patient has spots, moles and lesions to be evaluated, some may be new or changing. She has rosacea/acne and treats with metronidazole 1% cream and tretinoin 0.05% cream. She also has a history of intertrigo of the lower abdomen and uses a nystatin powder to control outbreaks prescribed by her gynecologist. No improvement with ketoconazole cream. Hx AKs.   The following portions of the chart were reviewed this encounter and updated as appropriate:       Review of Systems:  No other skin or systemic complaints except as noted in HPI or Assessment and Plan.  Objective  Well appearing patient in no apparent distress; mood and affect are within normal limits.  A full examination was performed including scalp, head, eyes, ears, nose, lips, neck, chest, axillae, abdomen, back, buttocks, bilateral upper extremities, bilateral lower extremities, hands, feet, fingers, toes, fingernails, and toenails. All findings within normal limits unless otherwise noted below.  face Mid face erythema with telangiectasias +/- scattered inflammatory papules of the chin  superpubic Mild erythema of the superpubic fold  Left Oral Commissure Erythema and scale  chest, flank, back Blue macules  fingernails Brittle nails, with distal splitting  Right Upper Eyelid Pink scaly patch.    Assessment & Plan  Skin cancer screening performed today.  Actinic Damage - chronic, secondary to cumulative UV radiation exposure/sun exposure over time - diffuse scaly erythematous macules with underlying dyspigmentation - Recommend daily broad spectrum sunscreen SPF 30+ to sun-exposed areas, reapply every 2 hours as needed.  - Recommend staying in the shade or wearing long sleeves, sun glasses (UVA+UVB  protection) and wide brim hats (4-inch brim around the entire circumference of the hat). - Call for new or changing lesions.  Lentigines - Scattered tan macules - Due to sun exposure - Benign-appering, observe - Recommend daily broad spectrum sunscreen SPF 30+ to sun-exposed areas, reapply every 2 hours as needed. - Call for any changes  Hemangiomas - Red papules - Discussed benign nature - Observe - Call for any changes  Dermatofibroma - Firm pink/brown papulenodule with dimple sign left posterior shoulder, R post ankle - Benign appearing - Call for any changes  Seborrheic Keratoses - Stuck-on, waxy, tan-brown papules and/or plaques  - Benign-appearing - Discussed benign etiology and prognosis. - Observe - Call for any changes  Rosacea face  With Acne, Chronic condition with duration or expected duration over one year. Currently well-controlled.   Rosacea is a chronic progressive skin condition usually affecting the face of adults, causing redness and/or acne bumps. It is treatable but not curable. It sometimes affects the eyes (ocular rosacea) as well. It may respond to topical and/or systemic medication and can flare with stress, sun exposure, alcohol, exercise and some foods.  Daily application of broad spectrum spf 30+ sunscreen to face is recommended to reduce flares.  Continue metronidazole 1% cream Apply to face daily dsp 180g 1Rf. Continue tretinoin 0.05% cream Apply a pea-sized amount to face QHS dsp 135g 1Rf.  Topical retinoid medications like tretinoin/Retin-A, adapalene/Differin, tazarotene/Fabior, and Epiduo/Epiduo Forte can cause dryness and irritation when first started. Only apply a pea-sized amount to the entire affected area. Avoid applying it around the eyes, edges of mouth and creases at the nose. If you experience irritation, use a good moisturizer first  and/or apply the medicine less often. If you are doing well with the medicine, you can increase how  often you use it until you are applying every night. Be careful with sun protection while using this medication as it can make you sensitive to the sun. This medicine should not be used by pregnant women.    metronidazole (NORITATE) 1 % cream - face Apply topically daily. 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 Condition is symptomatic/ bothersome to patient. Not currently at goal.   Intertrigo is a chronic recurrent rash that occurs in skin fold areas that may be associated with friction; heat; moisture; yeast; fungus; and bacteria.  It is exacerbated by increased movement / activity; sweating; and higher atmospheric temperature.  Continue Nystop Powder daily or Zeasorb AF Powder. Rx sent in.   nystatin powder - superpubic Apply to skin folds daily.  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 Condition is bothersome/symptomatic for patient. Currently flared.   Angular Cheilitis is chronic skin irritation in folds of corners of mouth.  A moist environment from saliva promotes overgrowth of yeast and bacteria, resulting in chronic inflammation in this area.  Start ketoconazole 2% cream - Apply BID until improved. Pt has at home.   Tattoo of skin chest, flank, back  Secondary to Radiation Treatment from Breast Cancer.   Benign, observe.    Brittle nails fingernails  Chronic and persistent condition with duration or expected duration over one year. Condition is symptomatic / bothersome to patient. Not to goal.  Start DermaNail Apply to base of fingernails twice a day until improved.  Continue Biotin 2.'5mg'$  po QD. Recommend mild soap and moisturizing cream 1-2 times daily.  Avoid gel nails    Eyelid dermatitis, allergic/contact Right Upper Eyelid  Start ketoconazole 2% cream - Apply to AA eyelid  QD/BID or Start Eucrisa ointment - apply QD/BID AA eyelid until improved. Samples given. Lot TGBA Exp 03/2024   Return in about 1 year (around 03/26/2023) for TBSE.  IJamesetta Orleans, CMA, am acting as scribe for Brendolyn Patty, MD .  Documentation: I have reviewed the above documentation for accuracy and completeness, and I agree with the above.  Brendolyn Patty MD

## 2022-05-20 ENCOUNTER — Other Ambulatory Visit: Payer: Medicare Other

## 2022-05-20 NOTE — H&P (Signed)
Kayla Spencer is a 71 y.o. female presenting with Pre Op Consulting (Sign consents)   History of Present Illness: Patient  presents for preop exam due to postmenopausal bleeding. She has requested hyst. Discussed with her plan for  - LAVH, BSO with AP repair possible.    Workup: Pap 01/2022: neg  EMBx 04/2022:  ATROPHIC ENDOMETRIUM. NO HYPERPLASIA OR CARCINOMA.   TVUS 04/2022  Uterus=7.26 x 3.25 x 5.12cm Uterus anteverted Endometrium=6.73m(avg) Rt ovary appears wnl Left ovary appears wnl No free fluid seen Fibroid seen: Lt lateral(calcified)=1.5cm   Past Medical History:  has a past medical history of Allergic state, Anemia, Arthritis, Breast cancer (CMS-HCC), Chickenpox, GERD (gastroesophageal reflux disease), Hiatal hernia, Hyperlipidemia, Migraines, and Shingles.  Past Surgical History:  has a past surgical history that includes colonoscopy (12/01/2011); egd (10/26/2009); Tonsillectomy and adenoidectomy (1958); Bunionectomy (2000); Left breast lumpectomy (2012); cholecystectomy (05/2013); Colonoscopy (05/11/2017); Cholecystectomy; Dilation and curettage of uterus; and Hysteroscopy. Family History: family history includes Colon cancer in her paternal grandmother; High blood pressure (Hypertension) in her maternal grandfather; Myocardial Infarction (Heart attack) in her paternal grandfather; Osteoporosis (Thinning of bones) in her mother; Stroke in her maternal grandfather. Social History:  reports that she has never smoked. She has never used smokeless tobacco. She reports current alcohol use. She reports that she does not use drugs. OB/GYN History:  OB History       Gravida 2   Para 2   Term 2   Preterm     AB     Living 2       SAB     IAB     Ectopic     Molar     Multiple     Live Births 2        Allergies: is allergic to simvastatin and sulfa (sulfonamide antibiotics). Medications: Current Outpatient Medications:    amLODIPine  (NORVASC) 2.5 MG tablet, TAKE 1 TABLET ONCE DAILY, Disp: 90 tablet, Rfl: 1   biotin 1 mg Cap, Take by mouth, Disp: , Rfl:    cholecalciferol (VITAMIN D3) 1000 unit capsule, Take 1,000 Units by mouth once daily, Disp: , Rfl:    cholestyramine-aspartame (PREVALITE) 4 gram oral powder packet, Take 1 packet (4 g total) by mouth once daily Mix dose in 60-180 mL of water, milk or juice., Disp: 90 packet, Rfl: 3   cyanocobalamin (VITAMIN B12) 1000 MCG tablet, Take 1,000 mcg by mouth once daily., Disp: , Rfl:    esomeprazole (NEXIUM) 20 MG DR capsule, Take 20 mg by mouth once daily, Disp: , Rfl:    hydrOXYzine (ATARAX) 25 MG tablet, TAKE 1 TABLET BY MOUTH NIGHTLY AS NEEDED FOR ITCHING, Disp: 90 tablet, Rfl: 1   Lactobacillus acidophilus (PROBIOTIC ACIDOPHILUS ORAL), Take 1 capsule by mouth once daily (Patient not taking: Reported on 05/05/2022), Disp: , Rfl:    metroNIDAZOLE (METROGEL) 1 % gel, , Disp: , Rfl:    psyllium husk (METAMUCIL ORAL), Take by mouth Cookie form, Disp: , Rfl:    tretinoin (RETIN-A) 0.05 % cream, APPLY TO AFFECTED AREA AS DIRECTED AT BEDTIME, Disp: , Rfl:    Review of Systems: No SOB, no palpitations or chest pain, no new lower extremity edema, no nausea or vomiting or bowel or bladder complaints. See HPI for gyn specific ROS.    Exam:   BP 122/81   Pulse 103   Ht 160 cm ('5\' 3"'$ )   Wt 75.9 kg (167 lb 6.4 oz)   BMI 29.65 kg/m  General: Patient is well-groomed, well-nourished, appears stated age in no acute distress   HEENT: head is atraumatic and normocephalic, trachea is midline, neck is supple with no palpable nodules   CV: Regular rhythm and normal heart rate, no murmur   Pulm: Clear to auscultation throughout lung fields with no wheezing, crackles, or rhonchi. No increased work of breathing   Abdomen: soft , no mass, non-tender, no rebound tenderness, no hepatomegaly   Pelvic: deferred    Impression:   The primary encounter diagnosis was Post-menopausal  bleeding. A diagnosis of Preop examination was also pertinent to this visit.   Plan:   1. Preop Exam: LAVH, BSO and AP repair -Patient returns for a preoperative discussion regarding her plans to proceed with surgical treatment of her PMB, anterior prolapse by the aforementioned procedure.  We may perform a cystoscopy to evaluate the urinary tract after the procedure, if surgically indicated for uro tract integrity.    The patient and I discussed the technical aspects of the procedure including the potential for risks and complications.  These include but are not limited to the risk of infection requiring post-operative antibiotics or further procedures.  We talked about the risk of injury to adjacent organs including bladder, bowel, ureter, blood vessels or nerves.  We talked about the need to convert to an open incision.  We talked about the possible need for blood transfusion.  We talked about postop complications such as thromboembolic or cardiopulmonary complications.  All of her questions were answered.  Her preoperative exam was completed and the appropriate consents were signed. She is scheduled to undergo this procedure in the near future.   Specific Peri-operative Considerations:  - Consent: obtained today - Health Maintenance:  - Labs: CBC, CMP preoperatively - Studies: EKG, CXR preoperatively - Bowel Preparation: None required - Abx:  Ancef 2g - VTE ppx: SCDs perioperatively - Glucose Protocol:  - Beta-blockade:    Diagnoses and all orders for this visit:   Post-menopausal bleeding   Preop examination

## 2022-05-29 ENCOUNTER — Other Ambulatory Visit: Payer: Self-pay

## 2022-05-29 ENCOUNTER — Encounter
Admission: RE | Admit: 2022-05-29 | Discharge: 2022-05-29 | Disposition: A | Discharge status: Home / Self Care | Payer: Medicare Other | Source: Ambulatory Visit | Attending: Obstetrics and Gynecology | Admitting: Obstetrics and Gynecology

## 2022-05-29 VITALS — Ht 63.0 in | Wt 165.0 lb

## 2022-05-29 DIAGNOSIS — Z01810 Encounter for preprocedural cardiovascular examination: Secondary | ICD-10-CM

## 2022-05-29 NOTE — Patient Instructions (Addendum)
Your procedure is scheduled on: June 05, 2022 THURSDAY Report to the Registration Desk on the 1st floor of the [facility]. To find out your arrival time, please call [phone] between [facility] [place] on: WEDNESDAY June 04, 2022 If your arrival time is 6:00 am, do not arrive prior to that time as the [facility] entrance doors do not open until 6:00 am.  REMEMBER: Instructions that are not followed completely may result in serious medical risk, up to and including death; or upon the discretion of your surgeon and anesthesiologist your surgery may need to be rescheduled.  Do not eat food after midnight the night before surgery.  No gum chewing, lozengers or hard candies.  You may however, drink CLEAR liquids up to 2 hours before you are scheduled to arrive for your surgery. Do not drink anything within 2 hours of your scheduled arrival time.  Clear liquids include: - water  - apple juice without pulp - gatorade (not RED colors) - black coffee or tea (Do NOT add milk or creamers to the coffee or tea) Do NOT drink anything that is not on this list.  TAKE THESE MEDICATIONS THE MORNING OF SURGERY WITH A SIP OF WATER: AMLODIPINE ESOMEPRAZOLE/NEXIUM (take one the night before and one on the morning of surgery - helps to prevent nausea after surgery.)   One week prior to surgery: Stop Anti-inflammatories (NSAIDS) such as Advil, Aleve, Ibuprofen, Motrin, Naproxen, Naprosyn and Aspirin based products such as Excedrin, Goodys Powder, BC Powder. Stop ANY OVER THE COUNTER supplements until after surgery. You may however, continue to take Tylenol if needed for pain up until the day of surgery.  No Alcohol for 24 hours before or after surgery.  No Smoking including e-cigarettes for 24 hours prior to surgery.  No chewable tobacco products for at least 6 hours prior to surgery.  No nicotine patches on the day of surgery.  Do not use any "recreational" drugs for at least a week prior to  your surgery.  Please be advised that the combination of cocaine and anesthesia may have negative outcomes, up to and including death. If you test positive for cocaine, your surgery will be cancelled.  On the morning of surgery brush your teeth with toothpaste and water, you may rinse your mouth with mouthwash if you wish. Do not swallow any toothpaste or mouthwash.  Use CHG Soap as directed on instruction sheet.  Do not wear jewelry, make-up, hairpins, clips or nail polish.  Do not wear lotions, powders, or perfumes OR DEODORANT  Do not shave body from the neck down 48 hours prior to surgery just in case you cut yourself which could leave a site for infection.  Also, freshly shaved skin may become irritated if using the CHG soap.  Contact lenses, hearing aids and dentures may not be worn into surgery.  Do not bring valuables to the hospital. [facility] is not responsible for any missing/lost belongings or valuables.   Notify your doctor if there is any change in your medical condition (cold, fever, infection).  Wear comfortable clothing (specific to your surgery type) to the hospital.  After surgery, you can help prevent lung complications by doing breathing exercises.  Take deep breaths and cough every 1-2 hours. Your doctor may order a device called an Incentive Spirometer to help you take deep breaths. When coughing or sneezing, hold a pillow firmly against your incision with both hands. This is called "splinting." Doing this helps protect your incision. It  also decreases belly discomfort.  If you are being discharged the day of surgery, you will not be allowed to drive home. You will need a responsible adult (18 years or older) to drive you home and stay with you that night.   If you are taking public transportation, you will need to have a responsible adult (18 years or older) with you. Please confirm with your physician that it is acceptable to use public transportation.    Please call the [facility] Dept. at [phone] if you have any questions about these instructions.  Surgery Visitation Policy:  Patients undergoing a surgery or procedure may have two family members or support persons with them as long as the person is not COVID-19 positive or experiencing its symptoms.

## 2022-05-30 ENCOUNTER — Encounter: Payer: Self-pay | Admitting: Urgent Care

## 2022-05-30 ENCOUNTER — Encounter
Admission: RE | Admit: 2022-05-30 | Discharge: 2022-05-30 | Disposition: A | Discharge status: Home / Self Care | Payer: Medicare Other | Source: Ambulatory Visit | Attending: Obstetrics and Gynecology | Admitting: Obstetrics and Gynecology

## 2022-05-30 DIAGNOSIS — N950 Postmenopausal bleeding: Secondary | ICD-10-CM | POA: Insufficient documentation

## 2022-05-30 DIAGNOSIS — Z01818 Encounter for other preprocedural examination: Secondary | ICD-10-CM | POA: Insufficient documentation

## 2022-05-30 DIAGNOSIS — Z01810 Encounter for preprocedural cardiovascular examination: Secondary | ICD-10-CM

## 2022-05-30 LAB — CBC
HCT: 41.2 % (ref 36.0–46.0)
Hemoglobin: 13.3 g/dL (ref 12.0–15.0)
MCH: 27.7 pg (ref 26.0–34.0)
MCHC: 32.3 g/dL (ref 30.0–36.0)
MCV: 85.7 fL (ref 80.0–100.0)
Platelets: 284 10*3/uL (ref 150–400)
RBC: 4.81 MIL/uL (ref 3.87–5.11)
RDW: 13.5 % (ref 11.5–15.5)
WBC: 8.2 10*3/uL (ref 4.0–10.5)
nRBC: 0 % (ref 0.0–0.2)

## 2022-05-30 LAB — BASIC METABOLIC PANEL
Anion gap: 9 (ref 5–15)
BUN: 10 mg/dL (ref 8–23)
CO2: 24 mmol/L (ref 22–32)
Calcium: 9 mg/dL (ref 8.9–10.3)
Chloride: 107 mmol/L (ref 98–111)
Creatinine, Ser: 0.73 mg/dL (ref 0.44–1.00)
GFR, Estimated: >60 mL/min (ref >60)
Glucose, Bld: 94 mg/dL (ref 70–99)
Potassium: 3.3 mmol/L — ABNORMAL LOW (ref 3.5–5.1)
Sodium: 140 mmol/L (ref 135–145)

## 2022-05-30 LAB — TYPE AND SCREEN
ABO/RH(D): B POS
Antibody Screen: NEGATIVE

## 2022-06-05 ENCOUNTER — Other Ambulatory Visit: Payer: Self-pay

## 2022-06-05 ENCOUNTER — Encounter
Admission: RE | Disposition: A | Discharge status: Home / Self Care | Payer: Self-pay | Source: Home / Self Care | Attending: Obstetrics and Gynecology

## 2022-06-05 ENCOUNTER — Ambulatory Visit: Payer: Medicare Other | Admitting: Urgent Care

## 2022-06-05 ENCOUNTER — Ambulatory Visit
Admission: RE | Admit: 2022-06-05 | Discharge: 2022-06-05 | Disposition: A | Discharge status: Home / Self Care | Payer: Medicare Other | Attending: Obstetrics and Gynecology | Admitting: Obstetrics and Gynecology

## 2022-06-05 ENCOUNTER — Encounter: Payer: Self-pay | Admitting: Obstetrics and Gynecology

## 2022-06-05 DIAGNOSIS — N838 Other noninflammatory disorders of ovary, fallopian tube and broad ligament: Secondary | ICD-10-CM | POA: Diagnosis not present

## 2022-06-05 DIAGNOSIS — N950 Postmenopausal bleeding: Secondary | ICD-10-CM

## 2022-06-05 DIAGNOSIS — N8189 Other female genital prolapse: Secondary | ICD-10-CM | POA: Diagnosis not present

## 2022-06-05 DIAGNOSIS — K219 Gastro-esophageal reflux disease without esophagitis: Secondary | ICD-10-CM | POA: Diagnosis not present

## 2022-06-05 DIAGNOSIS — E785 Hyperlipidemia, unspecified: Secondary | ICD-10-CM | POA: Diagnosis not present

## 2022-06-05 DIAGNOSIS — Z853 Personal history of malignant neoplasm of breast: Secondary | ICD-10-CM | POA: Insufficient documentation

## 2022-06-05 DIAGNOSIS — I10 Essential (primary) hypertension: Secondary | ICD-10-CM | POA: Insufficient documentation

## 2022-06-05 DIAGNOSIS — Z923 Personal history of irradiation: Secondary | ICD-10-CM | POA: Diagnosis not present

## 2022-06-05 DIAGNOSIS — Z8719 Personal history of other diseases of the digestive system: Secondary | ICD-10-CM | POA: Insufficient documentation

## 2022-06-05 DIAGNOSIS — D251 Intramural leiomyoma of uterus: Secondary | ICD-10-CM | POA: Diagnosis not present

## 2022-06-05 HISTORY — PX: CYSTOCELE REPAIR: SHX163

## 2022-06-05 HISTORY — PX: LAPAROSCOPIC VAGINAL HYSTERECTOMY WITH SALPINGO OOPHORECTOMY: SHX6681

## 2022-06-05 SURGERY — HYSTERECTOMY, VAGINAL, LAPAROSCOPY-ASSISTED, WITH SALPINGO-OOPHORECTOMY
Anesthesia: General

## 2022-06-05 MED ORDER — FENTANYL CITRATE (PF) 100 MCG/2ML IJ SOLN
INTRAMUSCULAR | Status: DC | PRN
  Administered 2022-06-05 (×2): 50 ug via INTRAVENOUS

## 2022-06-05 MED ORDER — GLYCOPYRROLATE 0.2 MG/ML IJ SOLN
INTRAMUSCULAR | Status: DC | PRN
  Administered 2022-06-05: .2 mg via INTRAVENOUS

## 2022-06-05 MED ORDER — HEMOSTATIC AGENTS (NO CHARGE) OPTIME
TOPICAL | Status: DC | PRN
  Administered 2022-06-05: 1 via TOPICAL

## 2022-06-05 MED ORDER — EPHEDRINE SULFATE (PRESSORS) 50 MG/ML IJ SOLN
INTRAMUSCULAR | Status: DC | PRN
  Administered 2022-06-05: 5 mg via INTRAVENOUS

## 2022-06-05 MED ORDER — DEXMEDETOMIDINE HCL IN NACL 200 MCG/50ML IV SOLN
INTRAVENOUS | Status: DC | PRN
  Administered 2022-06-05 (×2): 8 ug via INTRAVENOUS

## 2022-06-05 MED ORDER — 0.9 % SODIUM CHLORIDE (POUR BTL) OPTIME
TOPICAL | Status: DC | PRN
  Administered 2022-06-05: 200 mL

## 2022-06-05 MED ORDER — PHENYLEPHRINE 80 MCG/ML (10ML) SYRINGE FOR IV PUSH (FOR BLOOD PRESSURE SUPPORT)
PREFILLED_SYRINGE | INTRAVENOUS | Status: DC | PRN
  Administered 2022-06-05: 160 ug via INTRAVENOUS

## 2022-06-05 MED ORDER — OXYCODONE HCL 5 MG PO TABS
ORAL_TABLET | ORAL | Status: AC
  Filled 2022-06-05: qty 1

## 2022-06-05 MED ORDER — GABAPENTIN 300 MG PO CAPS
ORAL_CAPSULE | ORAL | Status: AC
  Administered 2022-06-05: 300 mg via ORAL
  Filled 2022-06-05: qty 1

## 2022-06-05 MED ORDER — PROPOFOL 1000 MG/100ML IV EMUL
INTRAVENOUS | Status: AC
  Filled 2022-06-05: qty 100

## 2022-06-05 MED ORDER — CHLORHEXIDINE GLUCONATE 0.12 % MT SOLN
OROMUCOSAL | Status: AC
  Administered 2022-06-05: 15 mL via OROMUCOSAL
  Filled 2022-06-05: qty 15

## 2022-06-05 MED ORDER — GABAPENTIN 300 MG PO CAPS: 300.0000 mg | ORAL_CAPSULE | ORAL | Status: AC

## 2022-06-05 MED ORDER — PHENYLEPHRINE HCL (PRESSORS) 10 MG/ML IV SOLN
INTRAVENOUS | Status: AC
  Filled 2022-06-05: qty 1

## 2022-06-05 MED ORDER — LIDOCAINE-EPINEPHRINE 1 %-1:100000 IJ SOLN
INTRAMUSCULAR | Status: DC | PRN
  Administered 2022-06-05: 6 mL
  Administered 2022-06-05: 4 mL

## 2022-06-05 MED ORDER — BUPIVACAINE HCL (PF) 0.5 % IJ SOLN
INTRAMUSCULAR | Status: AC
  Filled 2022-06-05: qty 30

## 2022-06-05 MED ORDER — HYDROMORPHONE HCL 1 MG/ML IJ SOLN
INTRAMUSCULAR | Status: AC
  Filled 2022-06-05: qty 1

## 2022-06-05 MED ORDER — IBUPROFEN 800 MG PO TABS: 800.0000 mg | ORAL_TABLET | Freq: Three times a day (TID) | ORAL | 1 refills | Status: AC

## 2022-06-05 MED ORDER — FENTANYL CITRATE (PF) 100 MCG/2ML IJ SOLN
INTRAMUSCULAR | Status: AC
  Filled 2022-06-05: qty 2

## 2022-06-05 MED ORDER — LACTATED RINGERS IV SOLN: INTRAVENOUS | Status: DC

## 2022-06-05 MED ORDER — PROPOFOL 10 MG/ML IV BOLUS
INTRAVENOUS | Status: DC | PRN
  Administered 2022-06-05: 200 mg via INTRAVENOUS

## 2022-06-05 MED ORDER — BUPIVACAINE HCL 0.5 % IJ SOLN
INTRAMUSCULAR | Status: DC | PRN
  Administered 2022-06-05: 18 mL

## 2022-06-05 MED ORDER — CEFAZOLIN SODIUM-DEXTROSE 2-4 GM/100ML-% IV SOLN
2.0000 g | INTRAVENOUS | Status: AC
  Administered 2022-06-05: 2 g via INTRAVENOUS

## 2022-06-05 MED ORDER — DOCUSATE SODIUM 100 MG PO CAPS: 100.0000 mg | ORAL_CAPSULE | Freq: Two times a day (BID) | ORAL | 0 refills | Status: DC

## 2022-06-05 MED ORDER — SEVOFLURANE IN SOLN
RESPIRATORY_TRACT | Status: AC
  Filled 2022-06-05: qty 250

## 2022-06-05 MED ORDER — ORAL CARE MOUTH RINSE: 15.0000 mL | Freq: Once | OROMUCOSAL | Status: AC

## 2022-06-05 MED ORDER — ONDANSETRON HCL 4 MG/2ML IJ SOLN
INTRAMUSCULAR | Status: DC | PRN
  Administered 2022-06-05 (×2): 4 mg via INTRAVENOUS

## 2022-06-05 MED ORDER — ONDANSETRON HCL 4 MG/2ML IJ SOLN: 4.0000 mg | Freq: Once | INTRAMUSCULAR | Status: DC | PRN

## 2022-06-05 MED ORDER — OXYCODONE HCL 5 MG/5ML PO SOLN: 5.0000 mg | Freq: Once | ORAL | Status: AC | PRN

## 2022-06-05 MED ORDER — SUGAMMADEX SODIUM 500 MG/5ML IV SOLN
INTRAVENOUS | Status: DC | PRN
  Administered 2022-06-05: 400 mg via INTRAVENOUS

## 2022-06-05 MED ORDER — POVIDONE-IODINE 10 % EX SWAB
2.0000 | Freq: Once | CUTANEOUS | Status: AC
  Administered 2022-06-05: 2 via TOPICAL

## 2022-06-05 MED ORDER — DEXAMETHASONE SODIUM PHOSPHATE 10 MG/ML IJ SOLN
INTRAMUSCULAR | Status: DC | PRN
  Administered 2022-06-05: 10 mg via INTRAVENOUS

## 2022-06-05 MED ORDER — KETAMINE HCL 50 MG/5ML IJ SOSY
PREFILLED_SYRINGE | INTRAMUSCULAR | Status: AC
  Filled 2022-06-05: qty 5

## 2022-06-05 MED ORDER — CHLORHEXIDINE GLUCONATE 0.12 % MT SOLN: 15.0000 mL | Freq: Once | OROMUCOSAL | Status: AC

## 2022-06-05 MED ORDER — FENTANYL CITRATE (PF) 100 MCG/2ML IJ SOLN
25.0000 ug | INTRAMUSCULAR | Status: DC | PRN
  Administered 2022-06-05 (×3): 25 ug via INTRAVENOUS

## 2022-06-05 MED ORDER — ACETAMINOPHEN 500 MG PO TABS
ORAL_TABLET | ORAL | Status: AC
  Administered 2022-06-05: 1000 mg via ORAL
  Filled 2022-06-05: qty 2

## 2022-06-05 MED ORDER — MIDAZOLAM HCL 2 MG/2ML IJ SOLN
INTRAMUSCULAR | Status: AC
  Filled 2022-06-05: qty 2

## 2022-06-05 MED ORDER — ACETAMINOPHEN EXTRA STRENGTH 500 MG PO TABS: 1000.0000 mg | ORAL_TABLET | Freq: Four times a day (QID) | ORAL | 0 refills | Status: AC

## 2022-06-05 MED ORDER — LIDOCAINE HCL (CARDIAC) PF 100 MG/5ML IV SOSY
PREFILLED_SYRINGE | INTRAVENOUS | Status: DC | PRN
  Administered 2022-06-05: 100 mg via INTRAVENOUS

## 2022-06-05 MED ORDER — FENTANYL CITRATE (PF) 100 MCG/2ML IJ SOLN
INTRAMUSCULAR | Status: AC
  Administered 2022-06-05: 25 ug via INTRAVENOUS
  Filled 2022-06-05: qty 2

## 2022-06-05 MED ORDER — PHENYLEPHRINE HCL-NACL 20-0.9 MG/250ML-% IV SOLN
INTRAVENOUS | Status: DC | PRN
  Administered 2022-06-05: 25 ug/min via INTRAVENOUS

## 2022-06-05 MED ORDER — ACETAMINOPHEN 500 MG PO TABS: 1000.0000 mg | ORAL_TABLET | ORAL | Status: AC

## 2022-06-05 MED ORDER — OXYCODONE HCL 5 MG PO TABS
5.0000 mg | ORAL_TABLET | Freq: Once | ORAL | Status: AC | PRN
  Administered 2022-06-05: 5 mg via ORAL

## 2022-06-05 MED ORDER — ROCURONIUM BROMIDE 100 MG/10ML IV SOLN
INTRAVENOUS | Status: DC | PRN
  Administered 2022-06-05: 60 mg via INTRAVENOUS
  Administered 2022-06-05: 10 mg via INTRAVENOUS

## 2022-06-05 MED ORDER — HYDROMORPHONE HCL 1 MG/ML IJ SOLN
INTRAMUSCULAR | Status: DC | PRN
  Administered 2022-06-05: 1 mg via INTRAVENOUS

## 2022-06-05 MED ORDER — CEFAZOLIN SODIUM-DEXTROSE 2-4 GM/100ML-% IV SOLN
INTRAVENOUS | Status: AC
  Filled 2022-06-05: qty 100

## 2022-06-05 MED ORDER — KETAMINE HCL 10 MG/ML IJ SOLN
INTRAMUSCULAR | Status: DC | PRN
  Administered 2022-06-05: 20 mg via INTRAVENOUS

## 2022-06-05 MED ORDER — LIDOCAINE-EPINEPHRINE 1 %-1:100000 IJ SOLN
INTRAMUSCULAR | Status: AC
  Filled 2022-06-05: qty 1

## 2022-06-05 MED ORDER — OXYCODONE HCL 5 MG PO TABS: 5.0000 mg | ORAL_TABLET | ORAL | 0 refills | Status: DC | PRN

## 2022-06-05 SURGICAL SUPPLY — 59 items
APPLICATOR ARISTA FLEXITIP XL (MISCELLANEOUS) IMPLANT
BAG URINE DRAIN 2000ML AR STRL (UROLOGICAL SUPPLIES) ×2 IMPLANT
BLADE SURG SZ10 CARB STEEL (BLADE) IMPLANT
BLADE SURG SZ11 CARB STEEL (BLADE) ×2 IMPLANT
CABLE HIGH FREQUENCY MONO STRZ (ELECTRODE) IMPLANT
CATH FOLEY 2WAY  5CC 16FR (CATHETERS) ×2
CATH URTH 16FR FL 2W BLN LF (CATHETERS) ×2 IMPLANT
DRAPE PERI LITHO V/GYN (MISCELLANEOUS) ×2 IMPLANT
DRAPE POUCH INSTRU U-SHP 10X18 (DRAPES) IMPLANT
DRAPE UNDER BUTTOCK W/FLU (DRAPES) ×2 IMPLANT
ELECT REM PT RETURN 9FT ADLT (ELECTROSURGICAL) ×2
ELECTRODE REM PT RTRN 9FT ADLT (ELECTROSURGICAL) ×2 IMPLANT
GLOVE BIO SURGEON STRL SZ7 (GLOVE) ×6 IMPLANT
GLOVE SURG UNDER LTX SZ7.5 (GLOVE) ×6 IMPLANT
GOWN STRL REUS W/ TWL LRG LVL3 (GOWN DISPOSABLE) ×6 IMPLANT
GOWN STRL REUS W/TWL LRG LVL3 (GOWN DISPOSABLE) ×12
HEMOSTAT ARISTA ABSORB 3G PWDR (HEMOSTASIS) IMPLANT
IRRIGATION STRYKERFLOW (MISCELLANEOUS) IMPLANT
IRRIGATOR STRYKERFLOW (MISCELLANEOUS) ×2
KIT PINK PAD W/HEAD ARE REST (MISCELLANEOUS) ×2
KIT PINK PAD W/HEAD ARM REST (MISCELLANEOUS) IMPLANT
L-HOOK LAP DISP 36CM (ELECTROSURGICAL)
LABEL OR SOLS (LABEL) ×2 IMPLANT
LHOOK LAP DISP 36CM (ELECTROSURGICAL) IMPLANT
LIGASURE VESSEL 5MM BLUNT TIP (ELECTROSURGICAL) ×2 IMPLANT
MANIFOLD NEPTUNE II (INSTRUMENTS) ×2 IMPLANT
MANIPULATOR VCARE STD CRV RETR (MISCELLANEOUS) IMPLANT
NDL SPNL 20GX3.5 QUINCKE YW (NEEDLE) IMPLANT
NEEDLE HYPO 22GX1.5 SAFETY (NEEDLE) ×2 IMPLANT
NEEDLE SPNL 20GX3.5 QUINCKE YW (NEEDLE) ×2 IMPLANT
NS IRRIG 500ML POUR BTL (IV SOLUTION) ×2 IMPLANT
OCCLUDER COLPOPNEUMO (BALLOONS) ×2 IMPLANT
PACK BASIN MINOR ARMC (MISCELLANEOUS) ×2 IMPLANT
PACK GYN LAPAROSCOPIC (MISCELLANEOUS) ×2 IMPLANT
PAD PREP 24X41 OB/GYN DISP (PERSONAL CARE ITEMS) ×2 IMPLANT
SCISSORS METZENBAUM CVD 33 (INSTRUMENTS) IMPLANT
SCRUB CHG 4% DYNA-HEX 4OZ (MISCELLANEOUS) ×2 IMPLANT
SLEEVE Z-THREAD 5X100MM (TROCAR) ×2 IMPLANT
SOL SCRUB PVP POV-IOD 4OZ 7.5% (MISCELLANEOUS) ×2
SOLUTION SCRB POV-IOD 4OZ 7.5% (MISCELLANEOUS) ×2 IMPLANT
STORZ HIGH FREQUENCY MONOPOLAR CABLES IMPLANT
SUT PDS AB 2-0 CT1 27 (SUTURE) ×2 IMPLANT
SUT VIC AB 0 CT1 27 (SUTURE) ×2
SUT VIC AB 0 CT1 27XCR 8 STRN (SUTURE) ×2 IMPLANT
SUT VIC AB 0 CT1 36 (SUTURE) ×6 IMPLANT
SUT VIC AB 2-0 CT1 36 (SUTURE) ×4 IMPLANT
SUT VIC AB 2-0 SH 27 (SUTURE) ×10
SUT VIC AB 2-0 SH 27XBRD (SUTURE) ×6 IMPLANT
SUT VIC AB 2-0 UR6 27 (SUTURE) ×2 IMPLANT
SUT VIC AB 3-0 SH 27 (SUTURE) ×2
SUT VIC AB 3-0 SH 27X BRD (SUTURE) ×2 IMPLANT
SUT VIC AB 4-0 FS2 27 (SUTURE) ×2 IMPLANT
SYR 10ML LL (SYRINGE) ×2 IMPLANT
SYR 50ML LL SCALE MARK (SYRINGE) IMPLANT
SYR CONTROL 10ML LL (SYRINGE) ×2 IMPLANT
TRAP FLUID SMOKE EVACUATOR (MISCELLANEOUS) ×2 IMPLANT
TROCAR XCEL NON-BLD 5MMX100MML (ENDOMECHANICALS) ×2 IMPLANT
TUBING EVAC SMOKE HEATED PNEUM (TUBING) ×2 IMPLANT
WATER STERILE IRR 500ML POUR (IV SOLUTION) ×2 IMPLANT

## 2022-06-05 NOTE — Op Note (Signed)
Anjelina Bran PROCEDURE DATE: 06/05/2022  PREOPERATIVE DIAGNOSIS: Persistent postmenopausal bleeding, pelvic organ prolapse POSTOPERATIVE DIAGNOSIS: The same PROCEDURE: Laparoscopic assisted vaginal hysterectomy, bilateral salpingoophorectomy, anterior repair, modified McCall's culdoplasty  SURGEON:  Dr. Angelina Pih, MD ASSISTANT: Prentice Docker, MD  ANESTHESIOLOGIST: Iran Ouch, MD Anesthesiologist: Iran Ouch, MD; Martha Clan, MD CRNA: Tollie Eth, CRNA; Fletcher-Harrison, Administrator, arts, CRNA  INDICATIONS: 71 y.o. [phone] with aforementioned preoperative diagnoses here today for definitive surgical management.   Risks of surgery were discussed with the patient including but not limited to: bleeding which may require transfusion or reoperation; infection which may require antibiotics; injury to bowel, bladder, ureters or other surrounding organs; need for additional procedures including laparotomy; thromboembolic phenomenon, incisional problems and other postoperative/anesthesia complications. Written informed consent was obtained.    FINDINGS:  Small uterus, normal adnexa bilaterally.  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 Normal upper abdomen. Grade 3 anterior prolapse and Grade 2 apical prolapse.  ANESTHESIA:    General INTRAVENOUS FLUIDS: 1000 ml ESTIMATED BLOOD LOSS: 20 ml SPECIMENS: Uterus, cervix, bilateral fallopian tubes and ovaries. COMPLICATIONS: None immediate  PROCEDURE IN DETAIL:  The patient received intravenous antibiotics and had sequential compression devices applied to her lower extremities while in the preoperative area.  She was then taken to the operating room where general anesthesia was administered and was found to be adequate.  She was placed in the dorsal lithotomy position, and was prepped and draped in a sterile manner.  A Foley catheter was inserted into her bladder and attached to constant drainage and a uterine  manipulator was then advanced into the uterus.  After an adequate timeout was performed, attention was turned to the abdomen where an umbilical incision was made with the scalpel.  A 5-mm trocar and sleeve were then advanced without difficulty with the laparoscope under direct visualization into the abdomen.  The abdomen was then insufflated with carbon dioxide gas and adequate pneumoperitoneum was obtained. Bilateral 5-mm lower quadrant ports were then placed under direct visualization.  A survey of the patient's pelvis and abdomen revealed the findings as above.  The round ligaments were clamped and transected with the Ligasure device on both sides. The bilateral infundibulopelvic ligaments were also clamped and transected with the Ligasure device.  The bladder peritoneum was dissected away from the vaginal cuff. The uterus was followed to the uterine arteries, which were clamped, cauterized, and transected with the Ligasure device. Colpotomy completed because of the adhesive tissue requiring careful dissection under visualization. Excellent hemostasis was noted, the decision was made to leave the trocars in place and proceed with completing the hysterectomy via the vaginal route .  Attention was then turned to her pelvis.  A weighted speculum was then placed in the vagina. The uterus, tubes and ovaries were noted to be freed from all ligaments and was then delivered and sent to pathology.   After completion of the hysterectomy, all pedicles from the uterosacral ligament to the cornua were examined and hemostasis was confirmed.  A McCall's culdoplasty was performed, incorporating the uterosacral ligaments to the posterior vaginal cuff tightly. This was tagged for identification.   Anterior repair (Cystocele repair)   Attention was then turned to the anterior vaginal mucosa that was grasped in the midline approx 1cm proximate to the ureteral opening using an Allis clamp. Two Allis clamps were used to  identify the terminus of the incision just distal to the vaginal cuff and injected with 1% local 1:100000 lidocaine with epi. A  cross incision was made at the apex. A midline mucosal incision was made from the vaginal apex to the bladder neck, approx 1.5 cm from the urethral opening. The edges of this incision were grasped with a series of Kelly clamps and the vaginal mucosa was dissected off the underlying pubocervical fascia bilaterally using blunt and sharp dissection.  The dissection was taken to the paravaginal spaces bilaterally.  The pubocervical fascia was then plicated in the midline using 2-0 Vicryl sutures from the bladder neck to the bladder base resulting in excellent surgical correction of the cystocele.  Excess vaginal tissue was then excised bilaterally and the incision was then closed with 0 Vicryl in a running suture. The vaginal cuff was then closed in a running locked fashion with 0 Vicryl with care given to incorporate the uterosacral pedicles bilaterally.  Overall excellent hemostasis was noted.  A foley catheter was replaced and clear urine noted.  Attention was then returned to her abdomen which was insufflated again with carbon dioxide gas.  The laparoscope was used to survey the operative site, and it was found to be hemostatic.   No intraoperative injury to other surrounding organs was noted. Arista placed ppx. The abdomen was desufflated and all instruments were then removed from the patient's abdomen.   All skin incisions were closed with 3-0 Monocryl and Dermabond. The patient tolerated the procedures well.    All instruments, needles, and sponge counts were correct x 2. The patient was taken to the recovery room awake, extubated and in stable condition.

## 2022-06-05 NOTE — Interval H&P Note (Signed)
History and Physical Interval Note:  06/05/2022 7:40 AM  Kayla Spencer  has presented today for surgery, with the diagnosis of post menopausal bleeding, cystocele.  The various methods of treatment have been discussed with the patient and family. After consideration of risks, benefits and other options for treatment, the patient has consented to  Procedure(s): LAPAROSCOPIC ASSISTED VAGINAL HYSTERECTOMY WITH SALPINGO OOPHORECTOMY (Bilateral) ANTERIOR REPAIR (CYSTOCELE) (N/A) as a surgical intervention.  The patient's history has been reviewed, patient examined, no change in status, stable for surgery.  I have reviewed the patient's chart and labs.  Questions were answered to the patient's satisfaction.     Benjaman Kindler

## 2022-06-05 NOTE — Discharge Instructions (Addendum)
Discharge instructions after  Laparoscopically assisted vaginal hysterectomy  For the next three days, take ibuprofen and acetaminophen on a schedule, every 8 hours. You can take them together or you can intersperse them, and take one every four hours. I also gave you gabapentin for nighttime, to help you sleep and also to control pain. Take gabapentin medicines at night for at least the next 3 nights. You also have a narcotic, oxycodone, to take as needed if the above medicines don't help.  Postop constipation is a major cause of pain. Stay well hydrated, walk as you tolerate, and take over the counter senna as well as stool softeners if you need them.   Signs and Symptoms to Report Call our office at [phone] if you have any of the following.   Fever over 100.4 degrees or higher  Severe stomach pain not relieved with pain medications  Bright red bleeding that's heavier than a period that does not slow with rest  To go the bathroom a lot (frequency), you can't hold your urine (urgency), or it hurts when you empty your bladder (urinate)  Chest pain  Shortness of breath  Pain in the calves of your legs  Severe nausea and vomiting not relieved with anti-nausea medications  Signs of infection around your wounds, such as redness, hot to touch, swelling, green/yellow drainage (like pus), bad smelling discharge  Any concerns  What You Can Expect after Surgery  You may see some pink tinged, bloody fluid and bruising around the wound. This is normal.  You may have a sore throat because of the tube in your mouth during general anesthesia. This will go away in 2 to 3 days.  You may have some stomach cramps.  You may notice spotting on your panties.   Activities after Your Discharge Follow these guidelines to help speed your recovery at home:  Do the coughing and deep breathing as you did in the hospital for 2 weeks. Use the small blue breathing device, called the incentive spirometer for 2  weeks.  Don't drive if you are in pain or taking narcotic pain medicine. You may drive when you can safely slam on the brakes, turn the wheel forcefully, and rotate your torso comfortably. This is typically 1-2 weeks. Practice in a parking lot or side street prior to attempting to drive regularly.   Ask others to help with household chores for 4 weeks.  Do not lift anything heavier that 10 pounds for 4-6 weeks. This includes pets, children, and groceries.  Don't do strenuous activities, exercises, or sports like vacuuming, tennis, squash, etc. until your doctor says it is safe to do so. ---Maintain pelvic rest for 8 weeks. This means nothing in the vagina or rectum at all (no douching, tampons, intercourse) for 8 weeks.   Walk as you feel able. Rest often since it may take two or three weeks for your energy level to return to normal.   You may climb stairs  Avoid constipation:   -Eat fruits, vegetables, and whole grains. Eat small meals as your appetite will take time to return to normal.   -Drink 6 to 8 glasses of water each day unless your doctor has told you to limit your fluids.   -Use a laxative or stool softener as needed if constipation becomes a problem. You may take Miralax, metamucil, Citrucil, Colace, Senekot, FiberCon, etc. If this does not relieve the constipation, try two tablespoons of Milk Of Magnesia every 8 hours until your bowels move.  You may shower. Gently wash the wounds with a mild soap and water. Pat dry.  Do not get in a hot tub, swimming pool, etc. for 6 weeks.  Do not use lotions, oils, powders on the wounds.  Do not douche, use tampons, or have sex until your doctor says it is okay.  Take your pain medicine when you need it. The medicine may not work as well if the pain is bad.  Take the medicines you were taking before surgery. Other medications you will need are pain medications (Norco or Percocet) and nausea medications (Zofran).   Here is a helpful article from  the website [employer], regarding constipation  Here are reasons why constipation occurs after surgery: 1) During the operation and in the recovery room, most people are given opioid pain medication, primarily through an IV, to treat moderate or severe pain. Intravenous opioids include morphine, Dilaudid and fentanyl. After surgery, patients are often prescribed opioid pain medication to take by mouth at home, including codeine, Vicodin, Norco, and Percocet. All of these medications cause constipation by slowing down the movement of your intestine. 2) Changes in your diet before surgery can be another culprit. It is common to get specific instructions to change how you normally eat or drink before your surgery, like only having liquids the day before or not having anything to eat or drink after midnight the night before surgery. For this reason, temporary dehydration may occur. This, along with not eating or only having liquids, means that you are getting less fiber than usual. Both these factors contribute to constipation. 3) Changes in your diet after surgery can also contribute to the problem. Although many people don't have dietary restrictions after operations, being under anesthesia can make you lose your appetite for several hours and maybe even days. Some people can even have nausea or vomiting. Not eating or drinking normally means that you are not getting enough fiber and you can get dehydrated, both leading to constipation. 4) Lying in a bed more than usual--which happens before, during and after surgery--combined with the medications and diet changes, all work together to slow down your colon and make your poop turn to rock.  No one likes to be constipated.  Let's face it, it's not a pleasant feeling when you don't poop for days, then strain on the toilet to finally pass something large enough to cause damage. An ounce of prevention is worth a pound of cure, so: Assume you will be  constipated. Plan and prepare accordingly. Post-surgery is one of those unique situations where the temporary use of laxatives can make a world of difference. Always consult with your doctor, and recognize that if you wait several days after surgery to take a laxative, the constipation might be too severe for these over-the-counter options. It is always important to discuss all medications you plan on taking with your doctor. Ask your doctor if you can start the laxative immediately after surgery. *  Here are go-to post-surgery laxatives: Senna: Senna is an herb that acts as a "stimulant laxative," meaning it increases the activity of the intestine to cause you to have a bowel movement. It comes in many forms, but senna pills are easy to take and are sold over the counter at almost all pharmacies. Since opioid pain medications slow down the activity of the intestine, it makes sense to take a medication to help reverse that side effect. Long-term use of a stimulant laxative is not a good idea since it  can make your colon "lazy" and not function properly; however, temporary use immediately after surgery is acceptable. In general, if you are able to eat a normal diet, taking senna soon after surgery works the best. Senna usually works within hours to produce a bowel movement, but this is less predictable when you are taking different medications after surgery. Try not to wait several days to start taking senna, as often it is too late by then. Just like with all medications or supplements, check with your doctor before starting new treatment.   Magnesium: Magnesium is an important mineral that our body needs. We get magnesium from some foods that we eat, especially foods that are high in fiber such as broccoli, almonds and whole grains. There are also magnesium-based medications used to treat constipation including milk of magnesia (magnesium hydroxide), magnesium citrate and magnesium oxide. They work by  drawing water into the intestine, putting it into the class of "osmotic" laxatives. Magnesium products in low doses appear to be safe, but if taken in very large doses, can lead to problems such as irregular heartbeat, low blood pressure and even death. It can also affect other medications you might be taking, therefore it is important to discuss using magnesium with your physician and pharmacist before initiating therapy. Most over-the-counter magnesium laxatives work very well to help with the constipation related to surgery, but sometimes they work too well and lead to diarrhea. Make sure you are somewhere with easy access to a bathroom, just in case.   Bisacodyl: Bisacodyl (generic name) is sold under brand names such as Dulcolax. Much like senna, it is a "stimulant laxative," meaning it makes your intestines move more quickly to push out the stool. This is another good choice to start taking as soon as your doctor says you can take a laxative after surgery. It comes in pill form and as a suppository, which is a good choice for people who cannot or are not allowed to swallow pills. Studies have shown that it works as a laxative, but like most of these medications, you should use this on a short-term basis only.   Enema: Enemas strike fear in many people, but FEAR NOT! It's nowhere near as big a deal as you may think. An enema is just a way to get some liquid into your rectum by placing a specially designed device through your anus. If you have never done one, it might seem like a painful, unpleasant, uncomfortable, complicated and lengthy procedure. But in reality, it's simple, takes just a few seconds and is highly effective. The small ready-made bottles you buy at the pharmacy are much easier than the hose/large rubber container type. Those recommended positions illustrated in some instructions are generally not necessary to place the enema. It's very similar to the insertion of a tampon, requiring a  slight squat. Some extra lubrication on the enema's tip (or on your anus) will make it a breeze. In certain cases, there is no substitute for a good enema. For example, if someone has not pooped for a few days, the beginning of the poop waiting to come out can become rock hard. Passing that hard stool can lead to much pain and problems like anal fissures. Inserting a little liquid to break up the rock-hard stool will help make its passage much easier. Enemas come with different liquids. Most come with saline, but there are also mineral oil options. You can also use warm water in the reusable enema containers. They all work.  But since saline can sometimes be irritating, so try a mineral oil or water enema instead.  Here are commonly recommended constipation medications that do not work well for post-surgery constipation: Docusate: Docusate (generic name) most commonly referred to as Colace (brand name) is not really a laxative, but is classified as a stool softener. Although this medication is commonly prescribed, it is not recommended for several reasons: 1) there is no good medical evidence that it works 2) even if it has an effect, which is very questionable, it is minimal and cannot combat the intestinal slowing caused by the opioid medications. Skip docusate to save money and space in your pillbox for something more effective.  PEG: Miralax (brand name) is basically a chemical called polyethylene glycol (PEG) and it has gained tremendous popularity as a laxative. This product is an "osmotic laxative" meaning it works by pulling water into the stool, making it softer. This is very similar to the action of natural fiber in foods and supplements. Therefore, the effect seen by this medication is not immediate, causing a bowel movement in a day or more. Is this medication strong enough to battle the constipation related to having an operation? Maybe for some people not prone to constipation. But for most  people, other laxatives are better to prevent constipation after surgery. Ambulatory         AMBULATORY SURGERY  DISCHARGE INSTRUCTIONS   The drugs that you were given will stay in your system until tomorrow so for the next 24 hours you should not:  Drive an automobile Make any legal decisions Drink any alcoholic beverage   You may resume regular meals tomorrow.  Today it is better to start with liquids and gradually work up to solid foods.  You may eat anything you prefer, but it is better to start with liquids, then soup and crackers, and gradually work up to solid foods.   Please notify your doctor immediately if you have any unusual bleeding, trouble breathing, redness and pain at the surgery site, drainage, fever, or pain not relieved by medication.     Your post-operative visit with Dr.                                       is: Date:                        Time:    Please call to schedule your post-operative visit.  Additional Instructions:

## 2022-06-05 NOTE — Anesthesia Preprocedure Evaluation (Addendum)
Anesthesia Evaluation  Patient identified by MRN, date of birth, ID band Patient awake    Reviewed: Allergy & Precautions, NPO status , Patient's Chart, lab work & pertinent test results, reviewed documented beta blocker date and time   History of Anesthesia Complications Negative for: history of anesthetic complications  Airway Mallampati: II  TM Distance: >3 FB     Dental  (+) Dental Advidsory Given, Missing, Teeth Intact   Pulmonary neg pulmonary ROS   Pulmonary exam normal        Cardiovascular Exercise Tolerance: Good hypertension, (-) angina (-) Past MI and (-) Cardiac Stents Normal cardiovascular exam(-) dysrhythmias (-) Valvular Problems/Murmurs     Neuro/Psych  Headaches, neg Seizures    GI/Hepatic Neg liver ROS, hiatal hernia,GERD  ,,  Endo/Other  negative endocrine ROS    Renal/GU negative Renal ROS     Musculoskeletal  (+) Arthritis ,    Abdominal   Peds  Hematology   Anesthesia Other Findings Past Medical History: 2012: Breast cancer ([place])     Comment:  left breast No date: Chronic sinusitis No date: Dyspnea No date: GERD (gastroesophageal reflux disease) No date: Headache     Comment:  migraines No date: History of hiatal hernia No date: Hyperlipidemia No date: Hypertension No date: Osteoarthritis     Comment:  thumbs No date: Personal history of radiation therapy   Reproductive/Obstetrics                             Anesthesia Physical Anesthesia Plan  ASA: 3  Anesthesia Plan: General   Post-op Pain Management:    Induction: Intravenous  PONV Risk Score and Plan: 3 and Ondansetron, Dexamethasone, Midazolam and Treatment may vary due to age or medical condition  Airway Management Planned: Oral ETT  Additional Equipment:   Intra-op Plan:   Post-operative Plan: Extubation in OR  Informed Consent: I have reviewed the patients History and Physical, chart,  labs and discussed the procedure including the risks, benefits and alternatives for the proposed anesthesia with the patient or authorized representative who has indicated his/her understanding and acceptance.       Plan Discussed with: CRNA  Anesthesia Plan Comments:         Anesthesia Quick Evaluation

## 2022-06-05 NOTE — Anesthesia Procedure Notes (Signed)

## 2022-06-05 NOTE — Transfer of Care (Signed)
Immediate Anesthesia Transfer of Care Note  Patient: Conservation officer, historic buildings  Procedure(s) Performed: LAPAROSCOPIC ASSISTED VAGINAL HYSTERECTOMY WITH SALPINGO OOPHORECTOMY (Bilateral) ANTERIOR REPAIR (CYSTOCELE)  Patient Location: PACU  Anesthesia Type:General  Level of Consciousness: awake, drowsy, and patient cooperative  Airway & Oxygen Therapy: Patient Spontanous Breathing and Patient connected to face mask oxygen  Post-op Assessment: Report given to RN and Post -op Vital signs reviewed and stable Reviewed and stable Post vital signs:  Vital signs  Last Vitals:  Vitals Value Taken Time  BP    Temp    Pulse 101 06/05/22 1110  Resp 15 06/05/22 1110  SpO2 76 % 06/05/22 1110  Vitals shown include unvalidated device data.  Last Pain:  Vitals:   06/05/22 0626  TempSrc: Tympanic  PainSc: 0-No pain         Complications: No notable events documented.

## 2022-06-06 ENCOUNTER — Encounter: Payer: Self-pay | Admitting: Obstetrics and Gynecology

## 2022-06-06 NOTE — Anesthesia Postprocedure Evaluation (Signed)
Anesthesia Post Note  Patient: Conservation officer, historic buildings  Procedure(s) Performed: LAPAROSCOPIC ASSISTED VAGINAL HYSTERECTOMY WITH SALPINGO OOPHORECTOMY (Bilateral) ANTERIOR REPAIR (CYSTOCELE)  Patient location during evaluation: PACU Anesthesia Type: General Level of consciousness: awake and alert Pain management: pain level controlled Vital Signs Assessment: post-procedure vital signs reviewed and stable Respiratory status: spontaneous breathing, nonlabored ventilation and respiratory function stable Cardiovascular status: blood pressure returned to baseline and stable Postop Assessment: no apparent nausea or vomiting Anesthetic complications: no   No notable events documented.   Last Vitals:  Vitals:   06/05/22 1550 06/05/22 1551  BP: (!) 163/81 (!) 143/64  Pulse: 87 83  Resp:    Temp:    SpO2: 99% 99%    Last Pain:  Vitals:   06/05/22 1550  TempSrc:   PainSc: 0-No pain                 [place] Ouch

## 2022-06-09 LAB — SURGICAL PATHOLOGY

## 2022-07-30 ENCOUNTER — Ambulatory Visit (INDEPENDENT_AMBULATORY_CARE_PROVIDER_SITE_OTHER): Payer: Medicare Other | Admitting: Dermatology

## 2022-07-30 ENCOUNTER — Encounter: Payer: Self-pay | Admitting: Dermatology

## 2022-07-30 VITALS — BP 132/83 | HR 98

## 2022-07-30 DIAGNOSIS — L720 Epidermal cyst: Secondary | ICD-10-CM

## 2022-07-30 DIAGNOSIS — D692 Other nonthrombocytopenic purpura: Secondary | ICD-10-CM | POA: Diagnosis not present

## 2022-07-30 DIAGNOSIS — L299 Pruritus, unspecified: Secondary | ICD-10-CM

## 2022-07-30 DIAGNOSIS — K130 Diseases of lips: Secondary | ICD-10-CM

## 2022-07-30 MED ORDER — CLOBETASOL PROPIONATE 0.05 % EX SOLN: CUTANEOUS | 2 refills | Status: DC

## 2022-07-30 MED ORDER — KETOCONAZOLE 2 % EX CREA: TOPICAL_CREAM | CUTANEOUS | 2 refills | Status: AC

## 2022-07-30 NOTE — Progress Notes (Signed)
Follow-Up Visit   Subjective  Kayla Spencer is a 71 y.o. female who presents for the following: Skin Problem (Check spot at left corner of mouth. Using Nystatin/Triamcinolone cream as directed. Looks more like pimple now. Irritated at times. Dur: 2-3 months) and Pruritis (C/O itching all over her torso. Dur: 3 weeks. Had COVID booster December 1st. No changes with soaps, lotions, laundry detergent. Uses Dove soap, ).  Has long history with allergies. Takes allergy shots. Denies hx of eczema/skin rashes.   The patient has spots, moles and lesions to be evaluated, some may be new or changing and the patient has concerns that these could be cancer.   The following portions of the chart were reviewed this encounter and updated as appropriate:      Review of Systems: No other skin or systemic complaints except as noted in HPI or Assessment and Plan.   Objective  Well appearing patient in no apparent distress; mood and affect are within normal limits.  A focused examination was performed including face, torso. Relevant physical exam findings are noted in the Assessment and Plan.  torso Pink papules with excoriations on back, scattered erythematous papules at chest and abdomen  Left Oral Commissure Smooth white papule.    Assessment & Plan   Purpura - Chronic; persistent and recurrent.  Treatable, but not curable. Arms - Violaceous macules and patches - Benign - Related to trauma, age, sun damage and/or use of blood thinners, chronic use of topical and/or oral steroids - Observe - Can use OTC arnica containing moisturizer such as Dermend Bruise Formula if desired - Call for worsening or other concerns  Milia Left Oral Commissure  Symptomatic, irritating, patient would like treated.   Acne/Milia surgery - Left Oral Commissure Procedure risks and benefits were discussed with the patient and verbal consent was obtained. Following prep of the skin on the Left Oral Commissure with  an alcohol swab, extraction of milia was performed with two cotton tipped applicators following superficial incision made over their surfaces with a #11 surgical blade. Capillary hemostasis was achieved with 20% aluminum chloride solution. Vaseline ointment was applied to each site. The patient tolerated the procedure well.  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   Take Zyrtec 10 mg PO in mornings.    Eczema Skin Care  Buy TWO 16oz jars of CeraVe moisturizing cream  [facility], [facility], [facility] (no prescription needed)  Costs about $15 per jar   Jar #1: Use as a moisturizer as needed. Can be applied to any area of the body. Use twice daily to unaffected areas.  Jar #2: Pour one 19m bottle of clobetasol 0.05% solution into jar, mix well. Label this jar to indicate the medication has been added. Use twice daily to affected areas. Do not apply to face, groin or underarms.  Moisturizer may burn or sting initially. Try for at least 4 weeks.   Avoid applying to face, groin, and axilla. Use as directed. Long-term use can cause thinning of the skin.  Recommend OTC Gold Bond Rapid Relief Anti-Itch cream (pramoxine + menthol), CeraVe Anti-itch cream or lotion (pramoxine), Sarna lotion (Original- menthol + camphor or Sensitive- pramoxine) or Eucerin 12 hour Itch Relief lotion (menthol) up to 3 times per day to areas on body that are itchy.   clobetasol (TEMOVATE) 0.05 % external solution - torso Apply as directed. 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 Condition is symptomatic/ bothersome to patient. Not currently at goal.   Start Ketoconazole 2% cream twice daily to affected areas at mouth as needed.   Do not use Nystatin/Triamcinolone unless flared and inflamed bid  up to a week.  Caution skin atrophy with long-term use.   ketoconazole (NIZORAL) 2 % cream - Left Lower Cutaneous Lip Apply twice daily to affected area at corner of mouth as needed for rash/irritation   Return in about 4 weeks (around 08/27/2022) for Itching follow up.  I, Emelia Salisbury, CMA, am acting as scribe for Brendolyn Patty, MD.  Documentation: I have reviewed the above documentation for accuracy and completeness, and I agree with the above.  Brendolyn Patty MD

## 2022-07-30 NOTE — Patient Instructions (Addendum)
Bruising: Can use OTC arnica containing moisturizer such as Dermend Bruise Formula if desired   Itching: Continue Hydroxyzine 25 mg at bedtime as directed.   Take Zyrtec in mornings.   Eczema Skin Care  Buy TWO 16oz jars of CeraVe moisturizing cream  [facility], [facility], [facility] (no prescription needed)  Costs about $15 per jar   Jar #1: Use as a moisturizer as needed. Can be applied to any area of the body. Use twice daily to unaffected areas.  Jar #2: Pour one 75m bottle of clobetasol 0.05% solution into jar, mix well. Label this jar to indicate the medication has been added. Use twice daily to affected areas. Do not apply to face, groin or underarms.  Moisturizer may burn or sting initially. Try for at least 4 weeks.    Avoid applying to face, groin, and axilla. Use as directed. Long-term use can cause thinning of the skin.  Topical steroids (such as triamcinolone, fluocinolone, fluocinonide, mometasone, clobetasol, halobetasol, betamethasone, hydrocortisone) can cause thinning and lightening of the skin if they are used for too long in the same area. Your physician has selected the right strength medicine for your problem and area affected on the body. Please use your medication only as directed by your physician to prevent side effects.     Start Ketoconazole 2% cream twice daily to affected areas at mouth as needed.   Do not use Nystatin/Triamcinolone unless flared and inflamed.    Recommend OTC Gold Bond Rapid Relief Anti-Itch cream (pramoxine + menthol), CeraVe Anti-itch cream or lotion (pramoxine), Sarna lotion (Original- menthol + camphor or Sensitive- pramoxine) or Eucerin 12 hour Itch Relief lotion (menthol) up to 3 times per day to areas on body that are itchy.    Due to recent changes in healthcare laws, you may see results of your pathology and/or laboratory studies on MyChart before the doctors have had a chance to review them. We understand that in some cases there  may be results that are confusing or concerning to you. Please understand that not all results are received at the same time and often the doctors may need to interpret multiple results in order to provide you with the best plan of care or course of treatment. Therefore, we ask that you please give u[place]2 business days to thoroughly review all your results before contacting the office for clarification. Should we see a critical lab result, you will be contacted sooner.   If You Need Anything After Your Visit  If you have any questions or concerns for your doctor, please call our main line at [phone] press option 4 to reach your doctor's medical assistant. If no one answers, please leave a voicemail as directed and we will return your call as soon as possible. Messages left after 4 pm will be answered the following business day.   You may also send [place] message via [place] We typically respond to MyChart messages within 1-2 business days.  For prescription refills, please ask your pharmacy to contact our office. Our fax number is [phone]  If you have an urgent issue when the clinic is closed that cannot wait until the next business day, you can page your doctor at the number below.    Please note that while we do our best to be available for urgent issues outside of office hours, we are not available 24/7.   If you have an urgent issue and are unable to reach u[place] you may choose to seek medical  care at your doctor's office, retail clinic, urgent care center, or emergency room.  If you have a medical emergency, please immediately call 911 or go to the emergency department.  Pager Numbers  - Dr. Nehemiah Massed: [phone]  - Dr. Laurence Ferrari: [phone]  - Dr. Nicole Kindred: [phone]  In the event of inclement weather, please call our main line at [phone] for an update on the status of any delays or closures.  Dermatology Medication Tips: Please keep the boxes that topical  medications come in in order to help keep track of the instructions about where and how to use these. Pharmacies typically print the medication instructions only on the boxes and not directly on the medication tubes.   If your medication is too expensive, please contact our office at [phone] option 4 or send [place] a message through [place].   We are unable to tell what your co-pay for medications will be in advance as this is different depending on your insurance coverage. However, we may be able to find a substitute medication at lower cost or fill out paperwork to get insurance to cover a needed medication.   If a prior authorization is required to get your medication covered by your insurance company, please allow [place] 1-2 business days to complete this process.  Drug prices often vary depending on where the prescription is filled and some pharmacies may offer cheaper prices.  The website www.goodrx.com contains coupons for medications through different pharmacies. The prices here do not account for what the cost may be with help from insurance (it may be cheaper with your insurance), but the website can give you the price if you did not use any insurance.  - You can print the associated coupon and take it with your prescription to the pharmacy.  - You may also stop by our office during regular business hours and pick up a GoodRx coupon card.  - If you need your prescription sent electronically to a different pharmacy, notify our office through [facility] or by phone at [phone] option 4.     Si Usted Necesita Algo Despus de Su Visita  Tambin puede enviarnos un mensaje a travs de Pharmacist, community. Por lo general respondemos a los mensajes de MyChart en el transcurso de 1 a 2 das hbiles.  Para renovar recetas, por favor pida a su farmacia que se ponga en contacto con nuestra oficina. Harland Dingwall de fax es [place] [phone].  Si tiene un asunto urgente cuando la clnica est  cerrada y que no puede esperar hasta el siguiente da hbil, puede llamar/localizar a su doctor(a) al nmero que aparece a continuacin.   Por favor, tenga en cuenta que aunque hacemos todo lo posible para estar disponibles para asuntos urgentes fuera del horario de [place], no estamos disponibles las 24 horas del da, los 7 das de la [place].   Si tiene un problema urgente y no puede comunicarse con nosotros, puede optar por buscar atencin mdica  en el consultorio de su doctor(a), en una clnica privada, en un centro de atencin urgente o en una sala de emergencias.  Si tiene Engineering geologist, por favor llame inmediatamente al 911 o vaya a la sala de emergencias.  Nmeros de bper  - Dr. Nehemiah Massed: [phone]  - Dra. Moye: [phone]  - Dra. Nicole Kindred: [phone]  En caso de inclemencias del [place], por favor llame a Johnsie Kindred principal al [phone] para una actualizacin sobre el [employer] de cualquier retraso o cierre.  Consejos para la medicacin  en dermatologa: Por favor, guarde las cajas en las que vienen los medicamentos de uso tpico para ayudarle a seguir las instrucciones sobre dnde y cmo usarlos. Las farmacias generalmente imprimen las instrucciones del medicamento slo en las cajas y no directamente en los tubos del [place].   Si su medicamento es muy caro, por favor, pngase en contacto con Zigmund Daniel llamando al [phone] y presione la opcin 4 o envenos un mensaje a travs de Pharmacist, community.   No podemos decirle cul ser su copago por los medicamentos por adelantado ya que esto es diferente dependiendo de la cobertura de su seguro. Sin embargo, es posible que podamos encontrar un medicamento sustituto a Electrical engineer un formulario para que el seguro cubra el medicamento que se considera necesario.   Si se requiere una autorizacin previa para que su compaa de seguros [place] su medicamento, por favor permtanos de 1 a 2 das hbiles para  completar este proceso.  Los precios de los medicamentos varan con frecuencia dependiendo del Environmental consultant de dnde se surte la receta y alguna farmacias pueden ofrecer precios ms baratos.  El sitio web www.goodrx.com tiene cupones para medicamentos de Airline pilot. Los precios aqu no tienen en cuenta lo que podra costar con la ayuda del seguro (puede ser ms barato con su seguro), pero el sitio web puede darle el precio si no utiliz Research scientist (physical sciences).  - Puede imprimir el cupn correspondiente y llevarlo con su receta a la farmacia.  - Tambin puede pasar por nuestra oficina durante el horario de atencin regular y Charity fundraiser una tarjeta de cupones de GoodRx.  - Si necesita que su receta se enve electrnicamente a una farmacia diferente, informe a nuestra oficina a travs de MyChart de [place] o por telfono llamando al [phone] y presione la opcin 4.

## 2022-09-09 ENCOUNTER — Ambulatory Visit: Payer: Medicare Other | Admitting: Dermatology

## 2022-12-01 ENCOUNTER — Other Ambulatory Visit: Payer: Self-pay | Admitting: Obstetrics and Gynecology

## 2022-12-01 DIAGNOSIS — Z1231 Encounter for screening mammogram for malignant neoplasm of breast: Secondary | ICD-10-CM

## 2023-01-22 ENCOUNTER — Ambulatory Visit
Admission: RE | Admit: 2023-01-22 | Discharge: 2023-01-22 | Disposition: A | Discharge status: Home / Self Care | Payer: Medicare Other | Source: Ambulatory Visit | Attending: Obstetrics and Gynecology | Admitting: Obstetrics and Gynecology

## 2023-01-22 DIAGNOSIS — Z1231 Encounter for screening mammogram for malignant neoplasm of breast: Secondary | ICD-10-CM | POA: Diagnosis not present

## 2023-02-17 IMAGING — MG MM DIGITAL SCREENING BILAT W/ TOMO AND CAD
8 series · 8 of 24 positions shown · non-contrast
Comparison: Previous exam(s).

CLINICAL DATA: Screening.

EXAM:
DIGITAL SCREENING BILATERAL MAMMOGRAM WITH TOMOSYNTHESIS AND CAD
TECHNIQUE: Bilateral screening digital craniocaudal and mediolateral oblique
mammograms were obtained. Bilateral screening digital breast
tomosynthesis was performed. The images were evaluated with
computer-aided detection.

[L CC synth-2D]
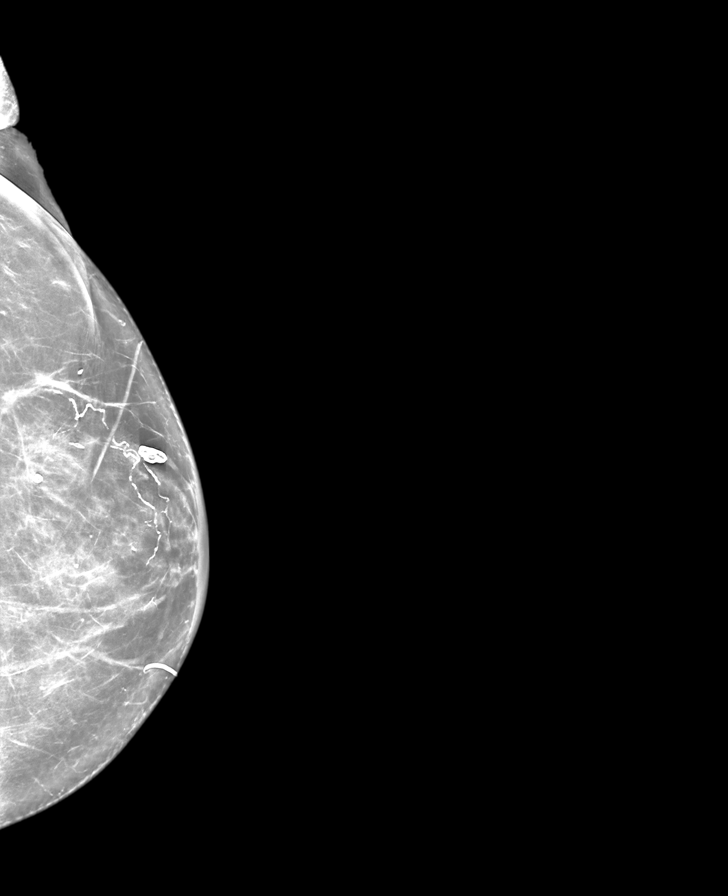

[R MLO synth-2D]
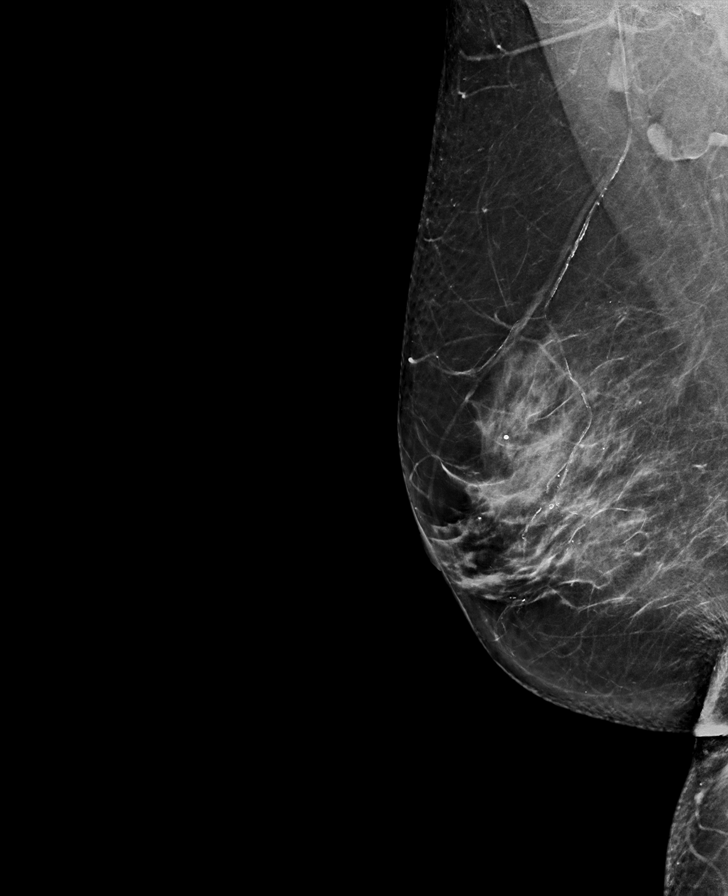

[R CC synth-2D]
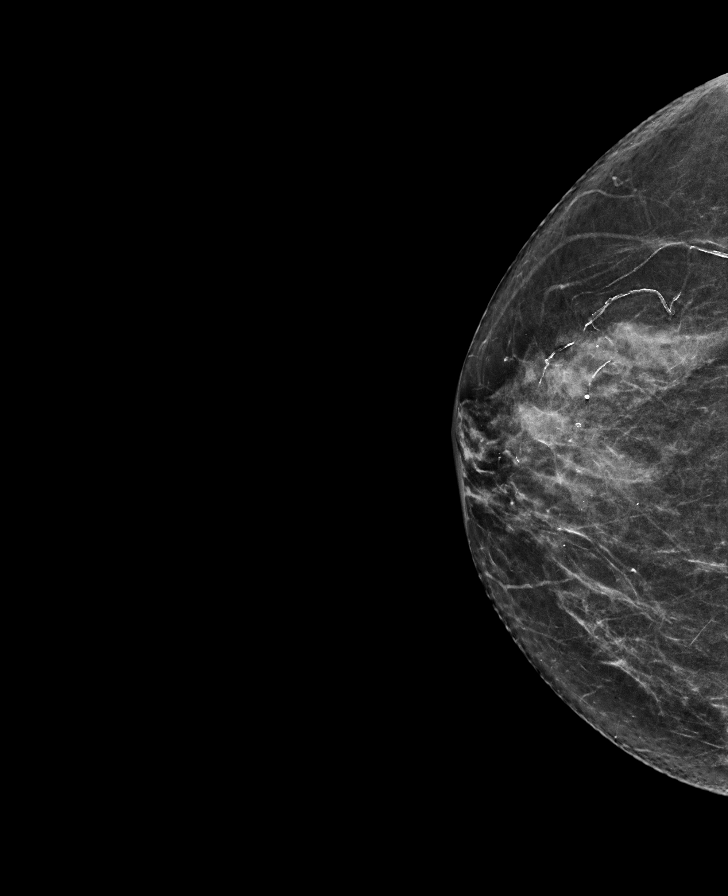

[L MLO synth-2D]
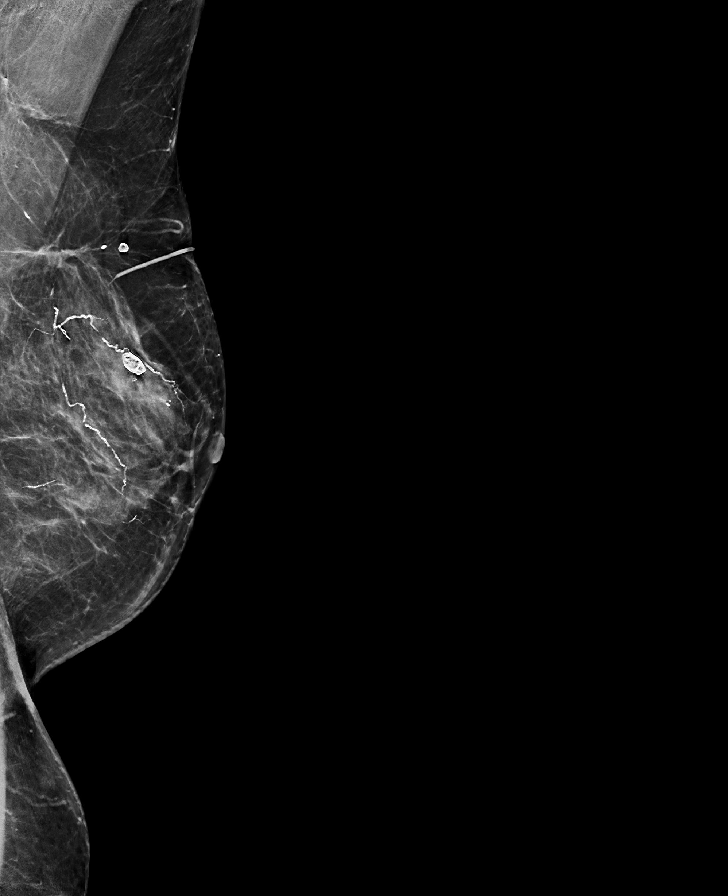

[L MLO tomo · tomo slice 37/72.0]
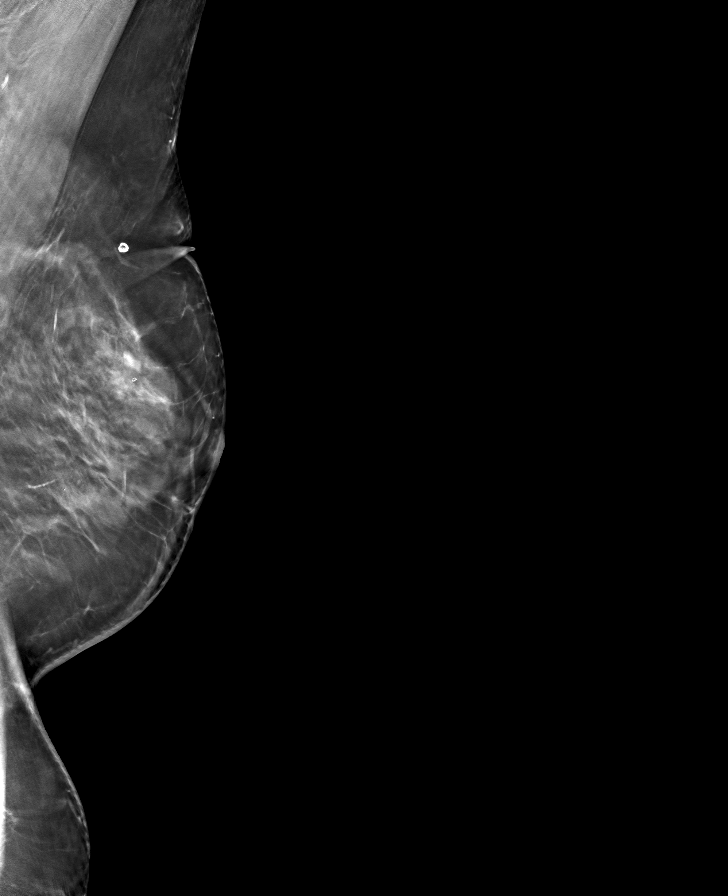

[R MLO tomo · tomo slice 43/85.0]
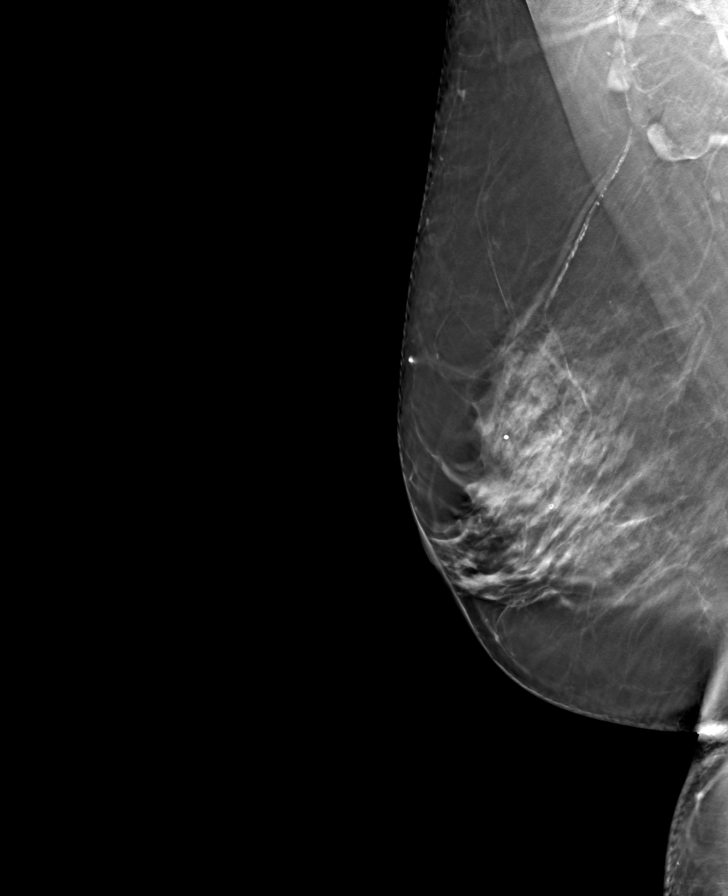

[L CC tomo · tomo slice 38/75.0]
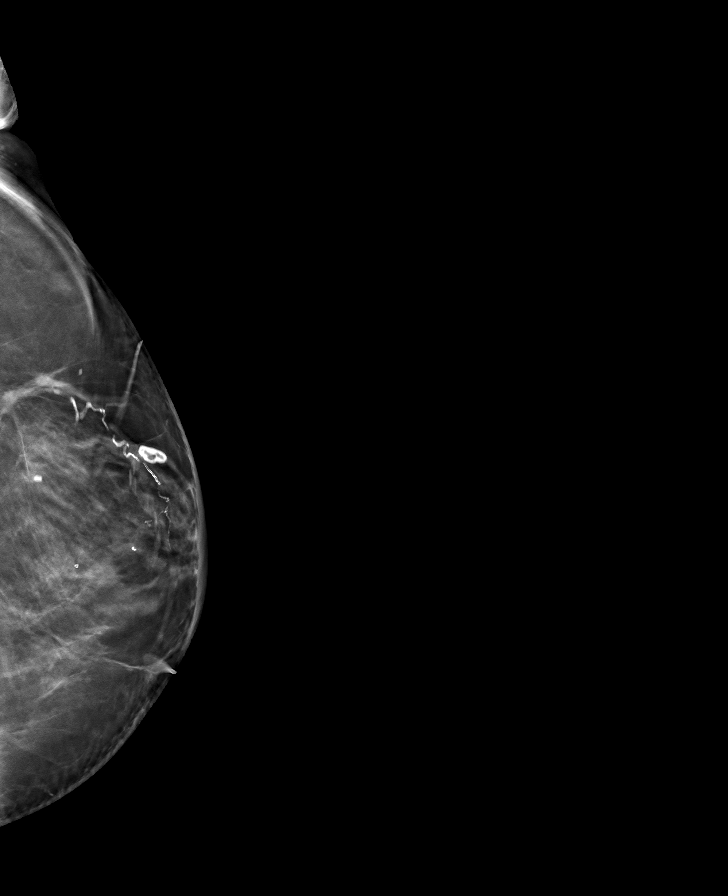

[R CC tomo · tomo slice 35/68.0]
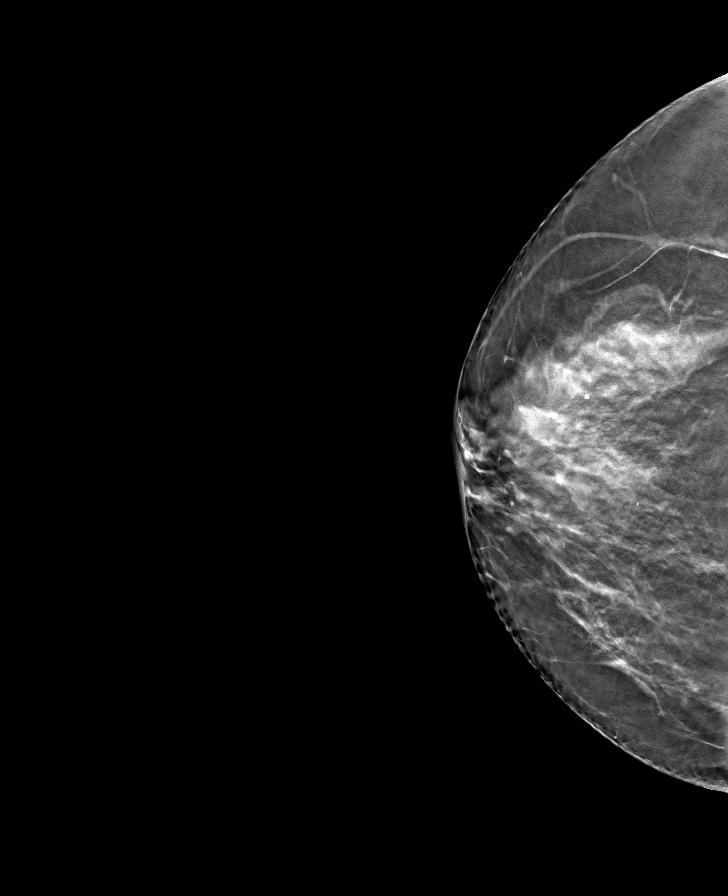

[8 of 24 positions shown; findings below may reference images not displayed]

ACR Breast Density Category c: The breast tissue is heterogeneously
dense, which may obscure small masses.
FINDINGS: There are no findings suspicious for malignancy.
IMPRESSION: No mammographic evidence of malignancy. A result letter of this
screening mammogram will be mailed directly to the patient.

RECOMMENDATION:
Screening mammogram in one year. (Code:[id number])

BI-RADS CATEGORY  1: Negative.

## 2023-04-13 ENCOUNTER — Encounter: Payer: Medicare Other | Admitting: Dermatology

## 2023-05-05 ENCOUNTER — Encounter: Payer: Self-pay | Admitting: Ophthalmology

## 2023-05-06 NOTE — Anesthesia Preprocedure Evaluation (Addendum)
Anesthesia Evaluation  Patient identified by MRN, date of birth, ID band Patient awake    Reviewed: Allergy & Precautions, H&P , NPO status , Patient's Chart, lab work & pertinent test results  Airway Mallampati: III  TM Distance: <3 FB Neck ROM: Full   Comment: 1 FB TMD, likely anterior airway if intubation were needed Dental no notable dental hx. (+) Caps   Pulmonary neg pulmonary ROS   Pulmonary exam normal breath sounds clear to auscultation       Cardiovascular hypertension, Normal cardiovascular exam Rhythm:Regular Rate:Normal     Neuro/Psych  Headaches  negative psych ROS   GI/Hepatic Neg liver ROS, hiatal hernia,GERD  ,,  Endo/Other  negative endocrine ROS    Renal/GU negative Renal ROS  negative genitourinary   Musculoskeletal negative musculoskeletal ROS (+) Arthritis ,    Abdominal   Peds negative pediatric ROS (+)  Hematology negative hematology ROS (+)   Anesthesia Other Findings Hyperlipidemia  Osteoarthritis GERD (gastroesophageal reflux disease) Chronic sinusitis History of hiatal hernia  Headache Breast cancer (HCC)  Personal history of radiation therapy Hypertension     Reproductive/Obstetrics negative OB ROS                              Anesthesia Physical Anesthesia Plan  ASA: 3  Anesthesia Plan: MAC   Post-op Pain Management:    Induction: Intravenous  PONV Risk Score and Plan:   Airway Management Planned: Natural Airway and Nasal Cannula  Additional Equipment:   Intra-op Plan:   Post-operative Plan:   Informed Consent: I have reviewed the patients History and Physical, chart, labs and discussed the procedure including the risks, benefits and alternatives for the proposed anesthesia with the patient or authorized representative who has indicated his/her understanding and acceptance.     Dental Advisory Given  Plan Discussed with:  Anesthesiologist, CRNA and Surgeon  Anesthesia Plan Comments: (Patient consented for risks of anesthesia including but not limited to:  - adverse reactions to medications - damage to eyes, teeth, lips or other oral mucosa - nerve damage due to positioning  - sore throat or hoarseness - Damage to heart, brain, nerves, lungs, other parts of body or loss of life  Patient voiced understanding and assent.)         Anesthesia Quick Evaluation

## 2023-05-11 NOTE — Discharge Instructions (Signed)

## 2023-05-13 ENCOUNTER — Ambulatory Visit
Admission: RE | Admit: 2023-05-13 | Discharge: 2023-05-13 | Disposition: A | Discharge status: Home / Self Care | Payer: Medicare Other | Attending: Ophthalmology | Admitting: Ophthalmology

## 2023-05-13 ENCOUNTER — Ambulatory Visit: Payer: Medicare Other | Admitting: Anesthesiology

## 2023-05-13 ENCOUNTER — Encounter: Payer: Self-pay | Admitting: Ophthalmology

## 2023-05-13 ENCOUNTER — Other Ambulatory Visit: Payer: Self-pay

## 2023-05-13 ENCOUNTER — Encounter
Admission: RE | Disposition: A | Discharge status: Home / Self Care | Payer: Self-pay | Source: Home / Self Care | Attending: Ophthalmology

## 2023-05-13 DIAGNOSIS — I10 Essential (primary) hypertension: Secondary | ICD-10-CM | POA: Insufficient documentation

## 2023-05-13 DIAGNOSIS — Z853 Personal history of malignant neoplasm of breast: Secondary | ICD-10-CM | POA: Diagnosis not present

## 2023-05-13 DIAGNOSIS — M1990 Unspecified osteoarthritis, unspecified site: Secondary | ICD-10-CM | POA: Diagnosis not present

## 2023-05-13 DIAGNOSIS — K449 Diaphragmatic hernia without obstruction or gangrene: Secondary | ICD-10-CM | POA: Diagnosis not present

## 2023-05-13 DIAGNOSIS — H2511 Age-related nuclear cataract, right eye: Secondary | ICD-10-CM | POA: Diagnosis present

## 2023-05-13 DIAGNOSIS — E785 Hyperlipidemia, unspecified: Secondary | ICD-10-CM | POA: Diagnosis not present

## 2023-05-13 DIAGNOSIS — J329 Chronic sinusitis, unspecified: Secondary | ICD-10-CM | POA: Diagnosis not present

## 2023-05-13 DIAGNOSIS — K219 Gastro-esophageal reflux disease without esophagitis: Secondary | ICD-10-CM | POA: Insufficient documentation

## 2023-05-13 DIAGNOSIS — R519 Headache, unspecified: Secondary | ICD-10-CM | POA: Diagnosis not present

## 2023-05-13 DIAGNOSIS — Z923 Personal history of irradiation: Secondary | ICD-10-CM | POA: Diagnosis not present

## 2023-05-13 HISTORY — PX: CATARACT EXTRACTION W/PHACO: SHX586

## 2023-05-13 SURGERY — PHACOEMULSIFICATION, CATARACT, WITH IOL INSERTION
Anesthesia: Monitor Anesthesia Care | Site: Eye | Laterality: Left

## 2023-05-13 MED ORDER — SIGHTPATH DOSE#1 BSS IO SOLN
INTRAOCULAR | Status: DC | PRN
  Administered 2023-05-13: 74 mL via OPHTHALMIC

## 2023-05-13 MED ORDER — TETRACAINE HCL 0.5 % OP SOLN
OPHTHALMIC | Status: AC
  Filled 2023-05-13: qty 4

## 2023-05-13 MED ORDER — FENTANYL CITRATE (PF) 100 MCG/2ML IJ SOLN
INTRAMUSCULAR | Status: DC | PRN
  Administered 2023-05-13: 100 ug via INTRAVENOUS

## 2023-05-13 MED ORDER — ARMC OPHTHALMIC DILATING DROPS
1.0000 | OPHTHALMIC | Status: DC | PRN
  Administered 2023-05-13 (×3): 1 via OPHTHALMIC

## 2023-05-13 MED ORDER — MIDAZOLAM HCL 2 MG/2ML IJ SOLN
INTRAMUSCULAR | Status: DC | PRN
  Administered 2023-05-13: 2 mg via INTRAVENOUS

## 2023-05-13 MED ORDER — TETRACAINE HCL 0.5 % OP SOLN
1.0000 [drp] | OPHTHALMIC | Status: DC | PRN
  Administered 2023-05-13 (×2): 1 [drp] via OPHTHALMIC

## 2023-05-13 MED ORDER — LACTATED RINGERS IV SOLN: INTRAVENOUS | Status: DC

## 2023-05-13 MED ORDER — SODIUM CHLORIDE 0.9% FLUSH: 10.0000 mL | Freq: Once | INTRAVENOUS | Status: DC

## 2023-05-13 MED ORDER — BRIMONIDINE TARTRATE-TIMOLOL 0.2-0.5 % OP SOLN
OPHTHALMIC | Status: DC | PRN
  Administered 2023-05-13: 1 [drp] via OPHTHALMIC

## 2023-05-13 MED ORDER — SIGHTPATH DOSE#1 BSS IO SOLN
INTRAOCULAR | Status: DC | PRN
  Administered 2023-05-13: 15 mL

## 2023-05-13 MED ORDER — SIGHTPATH DOSE#1 NA HYALUR & NA CHOND-NA HYALUR IO KIT
PACK | INTRAOCULAR | Status: DC | PRN
  Administered 2023-05-13: 1 via OPHTHALMIC

## 2023-05-13 MED ORDER — MIDAZOLAM HCL 2 MG/2ML IJ SOLN
INTRAMUSCULAR | Status: AC
  Filled 2023-05-13: qty 2

## 2023-05-13 MED ORDER — SIGHTPATH DOSE#1 BSS IO SOLN
INTRAOCULAR | Status: DC | PRN
  Administered 2023-05-13: 1 mL via INTRAMUSCULAR

## 2023-05-13 MED ORDER — ARMC OPHTHALMIC DILATING DROPS
OPHTHALMIC | Status: AC
  Filled 2023-05-13: qty 0.5

## 2023-05-13 MED ORDER — CEFUROXIME OPHTHALMIC INJECTION 1 MG/0.1 ML
INJECTION | OPHTHALMIC | Status: DC | PRN
  Administered 2023-05-13: .1 mL via INTRACAMERAL

## 2023-05-13 MED ORDER — FENTANYL CITRATE (PF) 100 MCG/2ML IJ SOLN
INTRAMUSCULAR | Status: AC
  Filled 2023-05-13: qty 2

## 2023-05-13 SURGICAL SUPPLY — 9 items
CATARACT SUITE SIGHTPATH (MISCELLANEOUS) ×1
FEE CATARACT SUITE SIGHTPATH (MISCELLANEOUS) ×1 IMPLANT
GLOVE SRG 8 PF TXTR STRL LF DI (GLOVE) ×1 IMPLANT
GLOVE SURG ENC TEXT LTX SZ7.5 (GLOVE) ×1 IMPLANT
GLOVE SURG UNDER POLY LF SZ8 (GLOVE) ×1
LENS IOL TECNIS EYHANCE 21.0 (Intraocular Lens) IMPLANT
NDL FILTER BLUNT 18X1 1/2 (NEEDLE) ×1 IMPLANT
NEEDLE FILTER BLUNT 18X1 1/2 (NEEDLE) ×1
SYR 3ML LL SCALE MARK (SYRINGE) ×1 IMPLANT

## 2023-05-13 NOTE — Transfer of Care (Signed)
Immediate Anesthesia Transfer of Care Note  Patient: Ambulance person  Procedure(s) Performed: CATARACT EXTRACTION PHACO AND INTRAOCULAR LENS PLACEMENT (IOC) LEFT  7.88  00:46.8 (Left: Eye)  Patient Location: PACU  Anesthesia Type: MAC  Level of Consciousness: awake, alert  and patient cooperative  Airway and Oxygen Therapy: Patient Spontanous Breathing and Patient connected to supplemental oxygen  Post-op Assessment: Post-op Vital signs reviewed, Patient's Cardiovascular Status Stable, Respiratory Function Stable, Patent Airway and No signs of Nausea or vomiting  Post-op Vital Signs: Reviewed and stable  Complications: No notable events documented.

## 2023-05-13 NOTE — H&P (Signed)
[facility]   Primary Care Physician:  Dorothey Baseman, MD Ophthalmologist: Dr. Lockie Mola  Pre-Procedure History & Physical: HPI:  Kayla Spencer is a 72 y.o. female here for ophthalmic surgery.   Past Medical History:  Diagnosis Date   Breast cancer (HCC) 2012   left breast   Chronic sinusitis    GERD (gastroesophageal reflux disease)    Headache    migraines   History of hiatal hernia    Hyperlipidemia    Hypertension    Osteoarthritis    thumbs   Personal history of radiation therapy     Past Surgical History:  Procedure Laterality Date   ABDOMINAL HYSTERECTOMY     BREAST BIOPSY Left 2012   positive   BREAST BIOPSY Left    negative, age 73, microcalcifications   BREAST BIOPSY Left 2010   negative    BREAST LUMPECTOMY Left 2012   LUMPECTOMY X2   BUNIONECTOMY Left    CHOLECYSTECTOMY     COLONOSCOPY WITH PROPOFOL N/A 05/11/2017   Procedure: COLONOSCOPY WITH PROPOFOL;  Surgeon: Christena Deem, MD;  Location[facility] ENDOSCOPY;  Service: Endoscopy;  Laterality: N/A;   CYSTOCELE REPAIR N/A 06/05/2022   Procedure: ANTERIOR REPAIR (CYSTOCELE);  Surgeon: Christeen Douglas, MD;  Location: [facility] ORS;  Service: Gynecology;  Laterality: N/A;   FINGER ARTHROPLASTY Right    thumb tendon replacement X2   HYSTEROSCOPY WITH D & C N/A 08/26/2021   Procedure: FRACTIONAL DILATATION AND CURETTAGE /HYSTEROSCOPY;  Surgeon: Christeen Douglas, MD;  Location: [facility] ORS;  Service: Gynecology;  Laterality: N/A;   LAPAROSCOPIC VAGINAL HYSTERECTOMY WITH SALPINGO OOPHORECTOMY Bilateral 06/05/2022   Procedure: LAPAROSCOPIC ASSISTED VAGINAL HYSTERECTOMY WITH SALPINGO OOPHORECTOMY;  Surgeon: Christeen Douglas, MD;  Location: [facility] ORS;  Service: Gynecology;  Laterality: Bilateral;   LASIK Bilateral 2001   POLYPECTOMY N/A 08/26/2021   Procedure: POLYPECTOMY;  Surgeon: Christeen Douglas, MD;  Location: [facility] ORS;  Service: Gynecology;  Laterality: N/A;   TONSILLECTOMY     TONSILLECTOMY  AND ADENOIDECTOMY      Prior to Admission medications   Medication Sig Start Date End Date Taking? Authorizing Provider  amLODipine (NORVASC) 2.5 MG tablet Take 2.5 mg by mouth daily.   Yes [provider]  Biotin 1000 MCG tablet Take 1,000 mcg by mouth daily.   Yes [provider]  cholecalciferol (VITAMIN D3) 25 MCG (1000 UNIT) tablet Take 1,000 Units by mouth daily.   Yes [provider]  cyanocobalamin 1000 MCG tablet Take 1,000 mcg by mouth daily.   Yes [provider]  docusate sodium (COLACE) 100 MG capsule Take 1 capsule (100 mg total) by mouth 2 (two) times daily. To keep stools soft 06/05/22  Yes Christeen Douglas, MD  esomeprazole (NEXIUM) 20 MG capsule Take 20 mg by mouth daily at 12 noon.   Yes [provider]  hydrOXYzine (ATARAX/VISTARIL) 25 MG tablet Take 25 mg by mouth daily.   Yes [provider]  ketoconazole (NIZORAL) 2 % cream Apply twice daily to affected area at corner of mouth as needed for rash/irritation 07/30/22  Yes Willeen Niece, MD  metronidazole (NORITATE) 1 % cream Apply topically daily. For rosacea 03/25/22  Yes Willeen Niece, MD  nystatin powder Apply to skin folds daily. 03/25/22  Yes Willeen Niece, MD  oxyCODONE (OXY IR/ROXICODONE) 5 MG immediate release tablet Take 1 tablet (5 mg total) by mouth every 4 (four) hours as needed for severe pain. 06/05/22  Yes Christeen Douglas, MD  tretinoin (RETIN-A) 0.05 % cream APPLY  A PEA SIZED AMOUNT TO FACE AS DIRECTED AT BEDTIME FOR ACNE 03/25/22  Yes Willeen Niece, MD  clobetasol (TEMOVATE) 0.05 % external solution Apply as directed. Avoid applying to face, groin, and axilla Patient not taking: Reported on 05/05/2023 07/30/22   Willeen Niece, MD  PREVALITE 4 g packet Take 4 g by mouth daily. Patient not taking: Reported on 05/05/2023 11/05/17   [provider]    Allergies as of 03/26/2023 - Review Complete 07/30/2022  Allergen Reaction Noted   Simvastatin Other  (See Comments) 03/05/2015   Sulfa antibiotics Itching and Rash 03/25/2012    Family History  Problem Relation Age of Onset   Cancer Father        brain tumor   Cancer Maternal Aunt        breast   Breast cancer Maternal Aunt    Cancer Paternal Grandmother        colon   Heart disease Paternal Grandfather     Social History   Socioeconomic History   Marital status: Married    Spouse name: Not on file   Number of children: Not on file   Years of education: Not on file   Highest education level: Not on file  Occupational History   Occupation: retired  Tobacco Use   Smoking status: Never   Smokeless tobacco: Never  Vaping Use   Vaping status: Never Used  Substance and Sexual Activity   Alcohol use: Not Currently   Drug use: No   Sexual activity: Yes    Partners: Male    Birth control/protection: Post-menopausal  Other Topics Concern   Not on file  Social History Narrative   Not on file   Social Determinants of Health   Financial Resource Strain: Low Risk  (11/24/2022)   Received from [facility], [facility]   Overall Financial Resource Strain (CARDIA)    Difficulty of Paying Living Expenses: Not hard at all  Food Insecurity: No Food Insecurity (11/24/2022)   [facility], [facility]   Hunger Vital Sign    Worried About Running Out of Food in the Last Year: Never true    Ran Out of Food in the Last Year: Never true  Transportation Needs: No Transportation Needs (11/24/2022)   Received from [facility], [facility]   PRAPARE - Transportation    In the past 12 months, has lack of transportation kept you from medical appointments or from getting medications?: No    Lack of Transportation (Non-Medical): No  Physical Activity: Not on file  Stress: Not on file  Social Connections: Not on file  Intimate Partner Violence: Not on file    Review of  Systems: See HPI, otherwise negative ROS  Physical Exam: Ht 5\' 4"  (1.626 m)   Wt 67.1 kg   BMI 25.40 kg/m  General:   Alert,  pleasant and cooperative in NAD Head:  Normocephalic and atraumatic. Lungs:  Clear to auscultation.    Heart:  Regular rate and rhythm.   Impression/Plan: Kayla Spencer is here for ophthalmic surgery.  Risks, benefits, limitations, and alternatives regarding ophthalmic surgery have been reviewed with the patient.  Questions have been answered.  All parties agreeable.   Lockie Mola, MD  05/13/2023, 7:39 AM

## 2023-05-13 NOTE — Op Note (Signed)
LOCATION:  [facility]   PREOPERATIVE DIAGNOSIS:    Nuclear sclerotic cataract right eye. H25.11   POSTOPERATIVE DIAGNOSIS:  Nuclear sclerotic cataract right eye.     PROCEDURE:  Phacoemusification with posterior chamber intraocular lens placement of the right eye   ULTRASOUND TIME: Procedure(s): CATARACT EXTRACTION PHACO AND INTRAOCULAR LENS PLACEMENT (IOC) LEFT  7.88  00:46.8 (Left)  LENS:   Implant Name Type Inv. Item Serial No. Manufacturer Lot No. LRB No. Used Action  LENS IOL TECNIS EYHANCE 21.0 - [id number] Intraocular Lens LENS IOL TECNIS EYHANCE 21.0 [id number] SIGHTPATH  Left 1 Implanted         SURGEON:  Deirdre Evener, MD   ANESTHESIA:  Topical with tetracaine drops and 2% Xylocaine jelly, augmented with 1% preservative-free intracameral lidocaine.    COMPLICATIONS:  None.   DESCRIPTION OF PROCEDURE:  The patient was identified in the holding room and transported to the operating room and placed in the supine position under the operating microscope.  The right eye was identified as the operative eye and it was prepped and draped in the usual sterile ophthalmic fashion.   A 1 millimeter clear-corneal paracentesis was made at the 12:00 position.  0.5 ml of preservative-free 1% lidocaine was injected into the anterior chamber. The anterior chamber was filled with Viscoat viscoelastic.  A 2.4 millimeter keratome was used to make a near-clear corneal incision at the 9:00 position.  A curvilinear capsulorrhexis was made with a cystotome and capsulorrhexis forceps.  Balanced salt solution was used to hydrodissect and hydrodelineate the nucleus.   Phacoemulsification was then used in stop and chop fashion to remove the lens nucleus and epinucleus.  The remaining cortex was then removed using the irrigation and aspiration handpiece. Provisc was then placed into the capsular bag to distend it for lens placement.  A lens was then injected into the capsular bag.  The  remaining viscoelastic was aspirated.   Wounds were hydrated with balanced salt solution.  The anterior chamber was inflated to a physiologic pressure with balanced salt solution.  No wound leaks were noted. Cefuroxime 0.1 ml of a 10mg /ml solution was injected into the anterior chamber for a dose of 1 mg of intracameral antibiotic at the completion of the case.   Timolol and Brimonidine drops were applied to the eye.  The patient was taken to the recovery room in stable condition without complications of anesthesia or surgery.   Teal Raben 05/13/2023, 9:12 AM

## 2023-05-13 NOTE — Anesthesia Postprocedure Evaluation (Signed)
Anesthesia Post Note  Patient: Ambulance person  Procedure(s) Performed: CATARACT EXTRACTION PHACO AND INTRAOCULAR LENS PLACEMENT (IOC) LEFT  7.88  00:46.8 (Left: Eye)  Patient location during evaluation: PACU Anesthesia Type: MAC Level of consciousness: awake and alert Pain management: pain level controlled Vital Signs Assessment: post-procedure vital signs reviewed and stable Respiratory status: spontaneous breathing, nonlabored ventilation, respiratory function stable and patient connected to nasal cannula oxygen Cardiovascular status: stable and blood pressure returned to baseline Postop Assessment: no apparent nausea or vomiting Anesthetic complications: no   No notable events documented.   Last Vitals:  Vitals:   05/13/23 0915 05/13/23 0918  BP: 135/84 119/74  Pulse: 95 99  Resp: 20 16  Temp:  (!) 36.2 C  SpO2: 95% 95%    Last Pain:  Vitals:   05/13/23 0918  TempSrc:   PainSc: 0-No pain                 Marisue Humble

## 2023-05-15 ENCOUNTER — Encounter: Payer: Self-pay | Admitting: Ophthalmology

## 2023-05-18 NOTE — Anesthesia Preprocedure Evaluation (Addendum)
Anesthesia Evaluation  Patient identified by MRN, date of birth, ID band Patient awake    Reviewed: Allergy & Precautions, NPO status , Patient's Chart, lab work & pertinent test results  History of Anesthesia Complications Negative for: history of anesthetic complications  Airway Mallampati: III   Neck ROM: Full    Dental no notable dental hx.    Pulmonary neg pulmonary ROS   Pulmonary exam normal breath sounds clear to auscultation       Cardiovascular hypertension, Normal cardiovascular exam Rhythm:Regular Rate:Normal     Neuro/Psych  Headaches    GI/Hepatic hiatal hernia,GERD  ,,  Endo/Other  negative endocrine ROS    Renal/GU negative Renal ROS     Musculoskeletal  (+) Arthritis ,    Abdominal   Peds  Hematology Breast CA   Anesthesia Other Findings   Reproductive/Obstetrics                             Anesthesia Physical Anesthesia Plan  ASA: 2  Anesthesia Plan: MAC   Post-op Pain Management:    Induction: Intravenous  PONV Risk Score and Plan: 2 and Treatment may vary due to age or medical condition, Midazolam and TIVA  Airway Management Planned: Natural Airway and Nasal Cannula  Additional Equipment:   Intra-op Plan:   Post-operative Plan:   Informed Consent: I have reviewed the patients History and Physical, chart, labs and discussed the procedure including the risks, benefits and alternatives for the proposed anesthesia with the patient or authorized representative who has indicated his/her understanding and acceptance.     Dental advisory given  Plan Discussed with: CRNA  Anesthesia Plan Comments: (LMA/GETA backup discussed.  Patient consented for risks of anesthesia including but not limited to:  - adverse reactions to medications - damage to eyes, teeth, lips or other oral mucosa - nerve damage due to positioning  - sore throat or hoarseness - damage to  heart, brain, nerves, lungs, other parts of body or loss of life  Informed patient about role of CRNA in peri- and intra-operative care.  Patient voiced understanding.)       Anesthesia Quick Evaluation

## 2023-05-25 NOTE — Discharge Instructions (Signed)

## 2023-05-27 ENCOUNTER — Ambulatory Visit: Payer: Medicare Other | Admitting: Anesthesiology

## 2023-05-27 ENCOUNTER — Ambulatory Visit
Admission: RE | Admit: 2023-05-27 | Discharge: 2023-05-27 | Disposition: A | Discharge status: Home / Self Care | Payer: Medicare Other | Attending: Ophthalmology | Admitting: Ophthalmology

## 2023-05-27 ENCOUNTER — Other Ambulatory Visit: Payer: Self-pay

## 2023-05-27 ENCOUNTER — Encounter
Admission: RE | Disposition: A | Discharge status: Home / Self Care | Payer: Self-pay | Source: Home / Self Care | Attending: Ophthalmology

## 2023-05-27 ENCOUNTER — Encounter: Payer: Self-pay | Admitting: Ophthalmology

## 2023-05-27 DIAGNOSIS — R519 Headache, unspecified: Secondary | ICD-10-CM | POA: Diagnosis not present

## 2023-05-27 DIAGNOSIS — K449 Diaphragmatic hernia without obstruction or gangrene: Secondary | ICD-10-CM | POA: Diagnosis not present

## 2023-05-27 DIAGNOSIS — K219 Gastro-esophageal reflux disease without esophagitis: Secondary | ICD-10-CM | POA: Insufficient documentation

## 2023-05-27 DIAGNOSIS — Z853 Personal history of malignant neoplasm of breast: Secondary | ICD-10-CM | POA: Insufficient documentation

## 2023-05-27 DIAGNOSIS — H2511 Age-related nuclear cataract, right eye: Secondary | ICD-10-CM | POA: Insufficient documentation

## 2023-05-27 DIAGNOSIS — M1990 Unspecified osteoarthritis, unspecified site: Secondary | ICD-10-CM | POA: Diagnosis not present

## 2023-05-27 DIAGNOSIS — I10 Essential (primary) hypertension: Secondary | ICD-10-CM | POA: Diagnosis not present

## 2023-05-27 HISTORY — PX: CATARACT EXTRACTION W/PHACO: SHX586

## 2023-05-27 SURGERY — PHACOEMULSIFICATION, CATARACT, WITH IOL INSERTION
Anesthesia: Monitor Anesthesia Care | Site: Eye | Laterality: Right

## 2023-05-27 MED ORDER — SIGHTPATH DOSE#1 BSS IO SOLN
INTRAOCULAR | Status: DC | PRN
  Administered 2023-05-27: 2 mL

## 2023-05-27 MED ORDER — TETRACAINE HCL 0.5 % OP SOLN
1.0000 [drp] | OPHTHALMIC | Status: DC | PRN
  Administered 2023-05-27 (×3): 1 [drp] via OPHTHALMIC

## 2023-05-27 MED ORDER — SIGHTPATH DOSE#1 BSS IO SOLN
INTRAOCULAR | Status: DC | PRN
  Administered 2023-05-27: 15 mL via INTRAOCULAR

## 2023-05-27 MED ORDER — ARMC OPHTHALMIC DILATING DROPS
1.0000 | OPHTHALMIC | Status: DC | PRN
  Administered 2023-05-27 (×3): 1 via OPHTHALMIC

## 2023-05-27 MED ORDER — ARMC OPHTHALMIC DILATING DROPS
OPHTHALMIC | Status: AC
  Filled 2023-05-27: qty 0.5

## 2023-05-27 MED ORDER — TETRACAINE HCL 0.5 % OP SOLN
OPHTHALMIC | Status: AC
  Filled 2023-05-27: qty 4

## 2023-05-27 MED ORDER — BRIMONIDINE TARTRATE-TIMOLOL 0.2-0.5 % OP SOLN
OPHTHALMIC | Status: DC | PRN
  Administered 2023-05-27: 1 [drp] via OPHTHALMIC

## 2023-05-27 MED ORDER — FENTANYL CITRATE (PF) 100 MCG/2ML IJ SOLN
INTRAMUSCULAR | Status: DC | PRN
  Administered 2023-05-27: 50 ug via INTRAVENOUS

## 2023-05-27 MED ORDER — MIDAZOLAM HCL 2 MG/2ML IJ SOLN
INTRAMUSCULAR | Status: AC
  Filled 2023-05-27: qty 2

## 2023-05-27 MED ORDER — SIGHTPATH DOSE#1 NA HYALUR & NA CHOND-NA HYALUR IO KIT
PACK | INTRAOCULAR | Status: DC | PRN
  Administered 2023-05-27: 1 via OPHTHALMIC

## 2023-05-27 MED ORDER — SIGHTPATH DOSE#1 BSS IO SOLN
INTRAOCULAR | Status: DC | PRN
  Administered 2023-05-27: 84 mL via OPHTHALMIC

## 2023-05-27 MED ORDER — FENTANYL CITRATE (PF) 100 MCG/2ML IJ SOLN
INTRAMUSCULAR | Status: AC
  Filled 2023-05-27: qty 2

## 2023-05-27 MED ORDER — CEFUROXIME OPHTHALMIC INJECTION 1 MG/0.1 ML
INJECTION | OPHTHALMIC | Status: DC | PRN
  Administered 2023-05-27: .1 mL via INTRACAMERAL

## 2023-05-27 MED ORDER — MIDAZOLAM HCL 2 MG/2ML IJ SOLN
INTRAMUSCULAR | Status: DC | PRN
  Administered 2023-05-27: 1 mg via INTRAVENOUS

## 2023-05-27 SURGICAL SUPPLY — 11 items
CANNULA ANT/CHMB 27G (MISCELLANEOUS) IMPLANT
CANNULA ANT/CHMB 27GA (MISCELLANEOUS)
CATARACT SUITE SIGHTPATH (MISCELLANEOUS) ×1
FEE CATARACT SUITE SIGHTPATH (MISCELLANEOUS) ×1 IMPLANT
GLOVE SRG 8 PF TXTR STRL LF DI (GLOVE) ×1 IMPLANT
GLOVE SURG ENC TEXT LTX SZ7.5 (GLOVE) ×1 IMPLANT
GLOVE SURG UNDER POLY LF SZ8 (GLOVE) ×1
LENS IOL TECNIS EYHANCE 19.0 (Intraocular Lens) IMPLANT
NDL FILTER BLUNT 18X1 1/2 (NEEDLE) ×1 IMPLANT
NEEDLE FILTER BLUNT 18X1 1/2 (NEEDLE) ×1
SYR 3ML LL SCALE MARK (SYRINGE) ×1 IMPLANT

## 2023-05-27 NOTE — Transfer of Care (Signed)
Immediate Anesthesia Transfer of Care Note  Patient: Ambulance person  Procedure(s) Performed: CATARACT EXTRACTION PHACO AND INTRAOCULAR LENS PLACEMENT (IOC) RIGHT 5.20 00:40.8 (Right: Eye)  Patient Location: PACU  Anesthesia Type: MAC  Level of Consciousness: awake, alert  and patient cooperative  Airway and Oxygen Therapy: Patient Spontanous Breathing and Patient connected to supplemental oxygen  Post-op Assessment: Post-op Vital signs reviewed, Patient's Cardiovascular Status Stable, Respiratory Function Stable, Patent Airway and No signs of Nausea or vomiting  Post-op Vital Signs: Reviewed and stable  Complications: No notable events documented.

## 2023-05-27 NOTE — Anesthesia Postprocedure Evaluation (Signed)
Anesthesia Post Note  Patient: Ambulance person  Procedure(s) Performed: CATARACT EXTRACTION PHACO AND INTRAOCULAR LENS PLACEMENT (IOC) RIGHT 5.20 00:40.8 (Right: Eye)  Patient location during evaluation: PACU Anesthesia Type: MAC Level of consciousness: awake and alert, oriented and patient cooperative Pain management: pain level controlled Vital Signs Assessment: post-procedure vital signs reviewed and stable Respiratory status: spontaneous breathing, nonlabored ventilation and respiratory function stable Cardiovascular status: blood pressure returned to baseline and stable Postop Assessment: adequate PO intake Anesthetic complications: no   No notable events documented.   Last Vitals:  Vitals:   05/27/23 1236 05/27/23 1240  BP: 125/89 114/79  Pulse: 94 95  Resp: (!) 22 (!) 24  Temp: 36.6 C 36.6 C  SpO2: 97% 95%    Last Pain:  Vitals:   05/27/23 1240  TempSrc:   PainSc: 0-No pain                 Reed Breech

## 2023-05-27 NOTE — Op Note (Signed)
LOCATION:  [facility]   PREOPERATIVE DIAGNOSIS:    Nuclear sclerotic cataract right eye. H25.11   POSTOPERATIVE DIAGNOSIS:  Nuclear sclerotic cataract right eye.     PROCEDURE:  Phacoemusification with posterior chamber intraocular lens placement of the right eye   ULTRASOUND TIME: Procedure(s): CATARACT EXTRACTION PHACO AND INTRAOCULAR LENS PLACEMENT (IOC) RIGHT 5.20 00:40.8 (Right)  LENS:   Implant Name Type Inv. Item Serial No. Manufacturer Lot No. LRB No. Used Action  LENS IOL TECNIS EYHANCE 19.0 - [id number] Intraocular Lens LENS IOL TECNIS EYHANCE 19.0 [id number] [employer]  Right 1 Implanted         SURGEON:  Deirdre Evener, MD   ANESTHESIA:  Topical with tetracaine drops and 2% Xylocaine jelly, augmented with 1% preservative-free intracameral lidocaine.    COMPLICATIONS:  None.   DESCRIPTION OF PROCEDURE:  The patient was identified in the holding room and transported to the operating room and placed in the supine position under the operating microscope.  The right eye was identified as the operative eye and it was prepped and draped in the usual sterile ophthalmic fashion.   A 1 millimeter clear-corneal paracentesis was made at the 12:00 position.  0.5 ml of preservative-free 1% lidocaine was injected into the anterior chamber. The anterior chamber was filled with Viscoat viscoelastic.  A 2.4 millimeter keratome was used to make a near-clear corneal incision at the 9:00 position.  A curvilinear capsulorrhexis was made with a cystotome and capsulorrhexis forceps.  Balanced salt solution was used to hydrodissect and hydrodelineate the nucleus.   Phacoemulsification was then used in stop and chop fashion to remove the lens nucleus and epinucleus.  The remaining cortex was then removed using the irrigation and aspiration handpiece. Provisc was then placed into the capsular bag to distend it for lens placement.  A lens was then injected into the capsular bag.  The  remaining viscoelastic was aspirated.   Wounds were hydrated with balanced salt solution.  The anterior chamber was inflated to a physiologic pressure with balanced salt solution.  No wound leaks were noted. Cefuroxime 0.1 ml of a 10mg /ml solution was injected into the anterior chamber for a dose of 1 mg of intracameral antibiotic at the completion of the case.   Timolol and Brimonidine drops were applied to the eye.  The patient was taken to the recovery room in stable condition without complications of anesthesia or surgery.   Kayla Spencer 05/27/2023, 12:35 PM

## 2023-05-27 NOTE — H&P (Signed)
[facility]   Primary Care Physician:  Dorothey Baseman, MD Ophthalmologist: Dr. Lockie Mola  Pre-Procedure History & Physical: HPI:  Kayla Spencer is a 72 y.o. female here for ophthalmic surgery.   Past Medical History:  Diagnosis Date   Breast cancer (HCC) 2012   left breast   Chronic sinusitis    GERD (gastroesophageal reflux disease)    Headache    migraines   History of hiatal hernia    Hyperlipidemia    Hypertension    Osteoarthritis    thumbs   Personal history of radiation therapy     Past Surgical History:  Procedure Laterality Date   ABDOMINAL HYSTERECTOMY     BREAST BIOPSY Left 2012   positive   BREAST BIOPSY Left    negative, age 32, microcalcifications   BREAST BIOPSY Left 2010   negative    BREAST LUMPECTOMY Left 2012   LUMPECTOMY X2   BUNIONECTOMY Left    CATARACT EXTRACTION W/PHACO Left 05/13/2023   Procedure: CATARACT EXTRACTION PHACO AND INTRAOCULAR LENS PLACEMENT (IOC) LEFT  7.88  00:46.8;  Surgeon: Lockie Mola, MD;  Location: [facility];  Service: Ophthalmology;  Laterality: Left;   CHOLECYSTECTOMY     COLONOSCOPY WITH PROPOFOL N/A 05/11/2017   Procedure: COLONOSCOPY WITH PROPOFOL;  Surgeon: Christena Deem, MD;  Location: [facility];  Service: Endoscopy;  Laterality: N/A;   CYSTOCELE REPAIR N/A 06/05/2022   Procedure: ANTERIOR REPAIR (CYSTOCELE);  Surgeon: Christeen Douglas, MD;  Location: [facility] ORS;  Service: Gynecology;  Laterality: N/A;   FINGER ARTHROPLASTY Right    thumb tendon replacement X2   HYSTEROSCOPY WITH D & C N/A 08/26/2021   Procedure: FRACTIONAL DILATATION AND CURETTAGE /HYSTEROSCOPY;  Surgeon: Christeen Douglas, MD;  Location: [facility] ORS;  Service: Gynecology;  Laterality: N/A;   LAPAROSCOPIC VAGINAL HYSTERECTOMY WITH SALPINGO OOPHORECTOMY Bilateral 06/05/2022   Procedure: LAPAROSCOPIC ASSISTED VAGINAL HYSTERECTOMY WITH SALPINGO OOPHORECTOMY;  Surgeon: Christeen Douglas, MD;  Location: [facility] ORS;   Service: Gynecology;  Laterality: Bilateral;   LASIK Bilateral 2001   POLYPECTOMY N/A 08/26/2021   Procedure: POLYPECTOMY;  Surgeon: Christeen Douglas, MD;  Location: [facility] ORS;  Service: Gynecology;  Laterality: N/A;   TONSILLECTOMY     TONSILLECTOMY AND ADENOIDECTOMY      Prior to Admission medications   Medication Sig Start Date End Date Taking? Authorizing Provider  amLODipine (NORVASC) 2.5 MG tablet Take 2.5 mg by mouth daily.   Yes [provider]  Biotin 1000 MCG tablet Take 1,000 mcg by mouth daily.   Yes [provider]  cholecalciferol (VITAMIN D3) 25 MCG (1000 UNIT) tablet Take 1,000 Units by mouth daily.   Yes [provider]  cyanocobalamin 1000 MCG tablet Take 1,000 mcg by mouth daily.   Yes [provider]  docusate sodium (COLACE) 100 MG capsule Take 1 capsule (100 mg total) by mouth 2 (two) times daily. To keep stools soft 06/05/22  Yes Christeen Douglas, MD  esomeprazole (NEXIUM) 20 MG capsule Take 20 mg by mouth daily at 12 noon.   Yes [provider]  hydrOXYzine (ATARAX/VISTARIL) 25 MG tablet Take 25 mg by mouth daily.   Yes [provider]  ketoconazole (NIZORAL) 2 % cream Apply twice daily to affected area at corner of mouth as needed for rash/irritation 07/30/22  Yes Willeen Niece, MD  metronidazole (NORITATE) 1 % cream Apply topically daily. For rosacea 03/25/22  Yes Willeen Niece, MD  nystatin powder Apply to skin folds daily. 03/25/22  Yes Willeen Niece, MD  tretinoin (RETIN-A) 0.05 % cream APPLY A PEA SIZED AMOUNT TO FACE AS DIRECTED AT BEDTIME FOR ACNE 03/25/22  Yes Willeen Niece, MD  clobetasol (TEMOVATE) 0.05 % external solution Apply as directed. Avoid applying to face, groin, and axilla Patient not taking: Reported on 05/05/2023 07/30/22   Willeen Niece, MD  oxyCODONE (OXY IR/ROXICODONE) 5 MG immediate release tablet Take 1 tablet (5 mg total) by mouth every 4 (four) hours as needed for severe pain. 06/05/22    Christeen Douglas, MD  PREVALITE 4 g packet Take 4 g by mouth daily. Patient not taking: Reported on 05/05/2023 11/05/17   [provider]    Allergies as of 03/26/2023 - Review Complete 07/30/2022  Allergen Reaction Noted   Simvastatin Other (See Comments) 03/05/2015   Sulfa antibiotics Itching and Rash 03/25/2012    Family History  Problem Relation Age of Onset   Cancer Father        brain tumor   Cancer Maternal Aunt        breast   Breast cancer Maternal Aunt    Cancer Paternal Grandmother        colon   Heart disease Paternal Grandfather     Social History   Socioeconomic History   Marital status: Married    Spouse name: Not on file   Number of children: Not on file   Years of education: Not on file   Highest education level: Not on file  Occupational History   Occupation: retired  Tobacco Use   Smoking status: Never   Smokeless tobacco: Never  Vaping Use   Vaping status: Never Used  Substance and Sexual Activity   Alcohol use: Not Currently   Drug use: No   Sexual activity: Yes    Partners: Male    Birth control/protection: Post-menopausal  Other Topics Concern   Not on file  Social History Narrative   Not on file   Social Determinants of Health   Financial Resource Strain: Low Risk  (11/24/2022)   Received from [facility], [facility]   Overall Financial Resource Strain (CARDIA)    Difficulty of Paying Living Expenses: Not hard at all  Food Insecurity: No Food Insecurity (11/24/2022)   Received from [facility], [facility]   Hunger Vital Sign    Worried About Running Out of Food in the Last Year: Never true    Ran Out of Food in the Last Year: Never true  Transportation Needs: No Transportation Needs (11/24/2022)   Received from [facility], [facility]   PRAPARE - Transportation    In the past 12 months, has lack of transportation kept  you from medical appointments or from getting medications?: No    Lack of Transportation (Non-Medical): No  Physical Activity: Not on file  Stress: Not on file  Social Connections: Not on file  Intimate Partner Violence: Not on file    Review of Systems: See HPI, otherwise negative ROS  Physical Exam: BP 135/77   Pulse 96   Temp (!) 96.4 F (35.8 C) (Temporal)   Resp 14   Ht 5' 4.02" (1.626 m)   Wt 69.3 kg   SpO2 96%   BMI 26.20 kg/m  General:   Alert,  pleasant and cooperative in NAD Head:  Normocephalic and atraumatic. Lungs:  Clear to auscultation.    Heart:  Regular rate and rhythm.   Impression/Plan: Qamar Kosaka is here for ophthalmic surgery.  Risks, benefits, limitations, and alternatives regarding ophthalmic surgery have been reviewed with the patient.  Questions have been answered.  All parties agreeable.   Lockie Mola, MD  05/27/2023, 11:30 AM

## 2023-05-28 ENCOUNTER — Encounter: Payer: Self-pay | Admitting: Ophthalmology

## 2023-08-18 ENCOUNTER — Ambulatory Visit: Payer: Medicare Other | Admitting: Dermatology

## 2023-08-18 DIAGNOSIS — L821 Other seborrheic keratosis: Secondary | ICD-10-CM

## 2023-08-18 DIAGNOSIS — D229 Melanocytic nevi, unspecified: Secondary | ICD-10-CM

## 2023-08-18 DIAGNOSIS — K130 Diseases of lips: Secondary | ICD-10-CM

## 2023-08-18 DIAGNOSIS — L299 Pruritus, unspecified: Secondary | ICD-10-CM

## 2023-08-18 DIAGNOSIS — D1801 Hemangioma of skin and subcutaneous tissue: Secondary | ICD-10-CM | POA: Diagnosis not present

## 2023-08-18 DIAGNOSIS — L300 Nummular dermatitis: Secondary | ICD-10-CM

## 2023-08-18 DIAGNOSIS — W908XXA Exposure to other nonionizing radiation, initial encounter: Secondary | ICD-10-CM

## 2023-08-18 DIAGNOSIS — Z1283 Encounter for screening for malignant neoplasm of skin: Secondary | ICD-10-CM | POA: Diagnosis not present

## 2023-08-18 DIAGNOSIS — L820 Inflamed seborrheic keratosis: Secondary | ICD-10-CM | POA: Diagnosis not present

## 2023-08-18 DIAGNOSIS — D492 Neoplasm of unspecified behavior of bone, soft tissue, and skin: Secondary | ICD-10-CM

## 2023-08-18 DIAGNOSIS — L814 Other melanin hyperpigmentation: Secondary | ICD-10-CM

## 2023-08-18 DIAGNOSIS — D485 Neoplasm of uncertain behavior of skin: Secondary | ICD-10-CM

## 2023-08-18 DIAGNOSIS — D239 Other benign neoplasm of skin, unspecified: Secondary | ICD-10-CM

## 2023-08-18 DIAGNOSIS — L719 Rosacea, unspecified: Secondary | ICD-10-CM

## 2023-08-18 DIAGNOSIS — D2239 Melanocytic nevi of other parts of face: Secondary | ICD-10-CM

## 2023-08-18 DIAGNOSIS — L578 Other skin changes due to chronic exposure to nonionizing radiation: Secondary | ICD-10-CM

## 2023-08-18 MED ORDER — TRETINOIN 0.05 % EX CREA: TOPICAL_CREAM | CUTANEOUS | 1 refills | Status: DC

## 2023-08-18 MED ORDER — CLOBETASOL PROPIONATE 0.05 % EX SOLN: CUTANEOUS | 2 refills | Status: DC

## 2023-08-18 MED ORDER — METRONIDAZOLE 0.75 % EX CREA
TOPICAL_CREAM | Freq: Two times a day (BID) | CUTANEOUS | 0 refills | Status: DC
Start: 2023-08-18 — End: 2023-11-23

## 2023-08-18 MED ORDER — CLOBETASOL PROPIONATE 0.05 % EX SOLN: CUTANEOUS | 2 refills | Status: AC

## 2023-08-18 MED ORDER — TACROLIMUS 0.1 % EX OINT: TOPICAL_OINTMENT | CUTANEOUS | 0 refills | Status: AC

## 2023-08-18 NOTE — Progress Notes (Signed)
 Follow-Up Visit   Subjective  Kayla Spencer is a 73 y.o. female who presents for the following: Skin Cancer Screening and Full Body Skin Exam  The patient presents for Total-Body Skin Exam (TBSE) for skin cancer screening and mole check. The patient has spots, moles and lesions to be evaluated, some may be new or changing. She has a few spots to check on the right temple, left side, and anterior neck.  Patient has irritation of the lower lip, off and on for months. She has been using Blistex to lips a lot. No change in toothpaste, lip products, facial products. Dentist wants patient checked for oral lichen planus.    The following portions of the chart were reviewed this encounter and updated as appropriate: medications, allergies, medical history  Review of Systems:  No other skin or systemic complaints except as noted in HPI or Assessment and Plan.  Objective  Well appearing patient in no apparent distress; mood and affect are within normal limits.  A full examination was performed including scalp, head, eyes, ears, nose, lips, neck, chest, axillae, abdomen, back, buttocks, bilateral upper extremities, bilateral lower extremities, hands, feet, fingers, toes, fingernails, and toenails. All findings within normal limits unless otherwise noted below.   Relevant physical exam findings are noted in the Assessment and Plan.  anterior neck x 1 Erythematous stuck-on, waxy papule Left Flank 5.0 mm red papule  Assessment & Plan   SKIN CANCER SCREENING PERFORMED TODAY.  ACTINIC DAMAGE - Chronic condition, secondary to cumulative UV/sun exposure - diffuse scaly erythematous macules with underlying dyspigmentation - Recommend daily broad spectrum sunscreen SPF 30+ to sun-exposed areas, reapply every 2 hours as needed.  - Staying in the shade or wearing long sleeves, sun glasses (UVA+UVB protection) and wide brim hats (4-inch brim around the entire circumference of the hat) are also  recommended for sun protection.  - Call for new or changing lesions.  LENTIGINES, SEBORRHEIC KERATOSES, HEMANGIOMAS - Benign normal skin lesions - Benign-appearing - Call for any changes  MELANOCYTIC NEVI - Tan-brown and/or pink-flesh-colored symmetric macules and papules; - 3.0 mm pink flesh papule at right cheek - Benign appearing on exam today - Observation - Call clinic for new or changing moles - Recommend daily use of broad spectrum spf 30+ sunscreen to sun-exposed areas.   ROSACEA With Acne, Chronic condition with duration or expected duration over one year. Currently well-controlled.   Exam Mid face mild erythema with telangiectasias  Chronic and persistent condition with duration or expected duration over one year. Condition is improving with treatment but not currently at goal.   Rosacea is a chronic progressive skin condition usually affecting the face of adults, causing redness and/or acne bumps. It is treatable but not curable. It sometimes affects the eyes (ocular rosacea) as well. It may respond to topical and/or systemic medication and can flare with stress, sun exposure, alcohol, exercise, topical steroids (including hydrocortisone/cortisone 10) and some foods.  Daily application of broad spectrum spf 30+ sunscreen to face is recommended to reduce flares.  Patient denies grittiness of the eyes  Treatment Plan Continue metronidazole  0.75% cream Apply to face daily dsp 180g 1Rf. Continue tretinoin  0.05% cream Apply a pea-sized amount to face QHS dsp 135g 1Rf.  Counseling for BBL / IPL / Laser and Coordination of Care Discussed the treatment option of Broad Band Light (BBL) /Intense Pulsed Light (IPL)/ Laser for skin discoloration, including brown spots and redness.  Typically we recommend at least 1-3 treatment sessions about 5-8  weeks apart for best results.  Cannot have tanned skin when BBL performed, and regular use of sunscreen/photoprotection is advised after the  procedure to help maintain results. The patient's condition may also require maintenance treatments in the future.  The fee for BBL / laser treatments is $350 per treatment session for the whole face.  A fee can be quoted for other parts of the body.  Insurance typically does not pay for BBL/laser treatments and therefore the fee is an out-of-pocket cost. Recommend prophylactic valtrex treatment. Once scheduled for procedure, will send Rx in prior to patient's appointment.   Topical retinoid medications like tretinoin /Retin-A , adapalene/Differin, tazarotene/Fabior, and Epiduo/Epiduo Forte can cause dryness and irritation when first started. Only apply a pea-sized amount to the entire affected area. Avoid applying it around the eyes, edges of mouth and creases at the nose. If you experience irritation, use a good moisturizer first and/or apply the medicine less often. If you are doing well with the medicine, you can increase how often you use it until you are applying every night. Be careful with sun protection while using this medication as it can make you sensitive to the sun. This medicine should not be used by pregnant women.   CHEILITIS  Exam Erythema with healing erosion at lower mucosal margin. No evidence of mucosal lichen planus in mouth.  Chronic and persistent condition with duration or expected duration over one year. Condition is bothersome/symptomatic for patient. Currently flared.   Treatment Plan d/c Blistex (possible contact irritation) Use plain Vaseline ointment during the day, sample given.  Start tacrolimus  0.1% ointment Apply to lower lip twice daily until healed dsp 30 g 0Rf. Avoid licking/biting area  Nummular Dermatitis with pruritus  Exam: Small pink papules on the left upper back.  Chronic and persistent condition with duration or expected duration over one year. Condition is bothersome/symptomatic for patient. Currently flared.   Nummular dermatitis (eczema) is a  chronic, relapsing, pruritic condition that can significantly affect quality of life. It is often associated with dry skin can require treatment with prescription topical medications, in addition to dry skin care.  If there is underlying atopic dermatitis and topicals are not working, then biologic injections may be necessary to control symptoms.   Treatment Plan: Continue clobetasol /CeraVe mix twice daily until improved. Refill sent in and put on hold.  Recommend mild soap and moisturizing cream 1-2 times daily.    DERMATOFIBROMA Exam: Firm pink/brown papulenodule with dimple sign at left post shoulder, right post ankle.  Treatment Plan: A dermatofibroma is a benign growth possibly related to trauma, such as an insect bite, cut from shaving, or inflamed acne-type bump.  Treatment options to remove include shave or excision with resulting scar and risk of recurrence.  Since benign-appearing and not bothersome, will observe for now.   INFLAMED SEBORRHEIC KERATOSIS anterior neck x 1 Symptomatic, irritating, patient would like treated. Destruction of lesion - anterior neck x 1  Destruction method: cryotherapy   Informed consent: discussed and consent obtained   Lesion destroyed using liquid nitrogen: Yes   Region frozen until ice ball extended beyond lesion: Yes   Outcome: patient tolerated procedure well with no complications   Post-procedure details: wound care instructions given   Additional details:  Prior to procedure, discussed risks of blister formation, small wound, skin dyspigmentation, or rare scar following cryotherapy. Recommend Vaseline ointment to treated areas while healing.  ROSACEA   Related Medications metronidazole  (NORITATE ) 1 % cream Apply topically daily. For rosacea NEOPLASM OF  UNCERTAIN BEHAVIOR OF SKIN Left Flank Epidermal / dermal shaving  Lesion diameter (cm):  0.5 Informed consent: discussed and consent obtained   Patient was prepped and draped in usual  sterile fashion: Area prepped with alcohol. Anesthesia: the lesion was anesthetized in a standard fashion   Anesthetic:  1% lidocaine  w/ epinephrine  1-100,000 buffered w/ 8.4% NaHCO3 Instrument used: flexible razor blade   Hemostasis achieved with: pressure, aluminum chloride and electrodesiccation   Outcome: patient tolerated procedure well   Post-procedure details: wound care instructions given   Post-procedure details comment:  Ointment and small bandage applied Specimen 1 - Surgical pathology Differential Diagnosis: Irritated Hemangioma vs other Check Margins: No Symptomatic, irritating, patient would like removed.  PRURITUS   Related Medications clobetasol  (TEMOVATE ) 0.05 % external solution Apply as directed. Avoid applying to face, groin, and axilla Return in about 1 year (around 08/17/2024) for TBSE.  Sooner if cheilitis doesn't improve  I, Andrea Kerns, CMA, am acting as scribe for Rexene Rattler, MD .   Documentation: I have reviewed the above documentation for accuracy and completeness, and I agree with the above.  Rexene Rattler, MD

## 2023-08-18 NOTE — Patient Instructions (Addendum)
 Cryotherapy Aftercare  Wash gently with soap and water everyday.   Apply Vaseline and Band-Aid daily until healed.     Wound Care Instructions  Cleanse wound gently with soap and water once a day then pat dry with clean gauze. Apply a thin coat of Petrolatum (petroleum jelly, Vaseline) over the wound (unless you have an allergy to this). We recommend that you use a new, sterile tube of Vaseline. Do not pick or remove scabs. Do not remove the yellow or white healing tissue from the base of the wound.  Cover the wound with fresh, clean, nonstick gauze and secure with paper tape. You may use Band-Aids in place of gauze and tape if the wound is small enough, but would recommend trimming much of the tape off as there is often too much. Sometimes Band-Aids can irritate the skin.  You should call the office for your biopsy report after 1 week if you have not already been contacted.  If you experience any problems, such as abnormal amounts of bleeding, swelling, significant bruising, significant pain, or evidence of infection, please call the office immediately.  FOR ADULT SURGERY PATIENTS: If you need something for pain relief you may take 1 extra strength Tylenol  (acetaminophen ) AND 2 Ibuprofen  (200mg  each) together every 4 hours as needed for pain. (do not take these if you are allergic to them or if you have a reason you should not take them.) Typically, you may only need pain medication for 1 to 3 days.   Eczema Skin Care  Buy TWO 16oz jars of CeraVe moisturizing cream  [facility], [facility], [facility] (no prescription needed)  Costs about $15 per jar   Jar #1: Use as a moisturizer as needed. Can be applied to any area of the body. Use twice daily to unaffected areas.  Jar #2: Pour one 50ml bottle of clobetasol  0.05% solution into jar, mix well. Label this jar to indicate the medication has been added. Use twice daily to affected areas. Do not apply to face, groin or underarms.  Moisturizer may  burn or sting initially. Try for at least 4 weeks.    Counseling for BBL / IPL / Laser and Coordination of Care Discussed the treatment option of Broad Band Light (BBL) /Intense Pulsed Light (IPL)/ Laser for skin discoloration, including brown spots and redness.  Typically we recommend at least 1-3 treatment sessions about 5-8 weeks apart for best results.  Cannot have tanned skin when BBL performed, and regular use of sunscreen/photoprotection is advised after the procedure to help maintain results. The patient's condition may also require maintenance treatments in the future.  The fee for BBL / laser treatments is $350 per treatment session for the whole face.  A fee can be quoted for other parts of the body.  Insurance typically does not pay for BBL/laser treatments and therefore the fee is an out-of-pocket cost. Recommend prophylactic valtrex treatment. Once scheduled for procedure, will send Rx in prior to patient's appointment.    Due to recent changes in healthcare laws, you may see results of your pathology and/or laboratory studies on MyChart before the doctors have had a chance to review them. We understand that in some cases there may be results that are confusing or concerning to you. Please understand that not all results are received at the same time and often the doctors may need to interpret multiple results in order to provide you with the best plan of care or course of treatment. Therefore, we ask that you  please give us  2 business days to thoroughly review all your results before contacting the office for clarification. Should we see a critical lab result, you will be contacted sooner.   If You Need Anything After Your Visit  If you have any questions or concerns for your doctor, please call our main line at [phone] and press option 4 to reach your doctor's medical assistant. If no one answers, please leave a voicemail as directed and we will return your call as soon as  possible. Messages left after 4 pm will be answered the following business day.   You may also send us  a message via MyChart. We typically respond to MyChart messages within 1-2 business days.  For prescription refills, please ask your pharmacy to contact our office. Our fax number is [phone].  If you have an urgent issue when the clinic is closed that cannot wait until the next business day, you can page your doctor at the number below.    Please note that while we do our best to be available for urgent issues outside of office hours, we are not available 24/7.   If you have an urgent issue and are unable to reach us , you may choose to seek medical care at your doctor's office, retail clinic, urgent care center, or emergency room.  If you have a medical emergency, please immediately call 911 or go to the emergency department.  Pager Numbers  - Dr. Hester: [phone]  - Dr. Jackquline: [phone]  - Dr. Claudene: [phone]   In the event of inclement weather, please call our main line at [phone] for an update on the status of any delays or closures.  Dermatology Medication Tips: Please keep the boxes that topical medications come in in order to help keep track of the instructions about where and how to use these. Pharmacies typically print the medication instructions only on the boxes and not directly on the medication tubes.   If your medication is too expensive, please contact our office at [phone] option 4 or send us  a message through MyChart.   We are unable to tell what your co-pay for medications will be in advance as this is different depending on your insurance coverage. However, we may be able to find a substitute medication at lower cost or fill out paperwork to get insurance to cover a needed medication.   If a prior authorization is required to get your medication covered by your insurance company, please allow us  1-2 business days to complete this  process.  Drug prices often vary depending on where the prescription is filled and some pharmacies may offer cheaper prices.  The website www.goodrx.com contains coupons for medications through different pharmacies. The prices here do not account for what the cost may be with help from insurance (it may be cheaper with your insurance), but the website can give you the price if you did not use any insurance.  - You can print the associated coupon and take it with your prescription to the pharmacy.  - You may also stop by our office during regular business hours and pick up a GoodRx coupon card.  - If you need your prescription sent electronically to a different pharmacy, notify our office through [facility] or by phone at [phone] option 4.     Si Usted Necesita Algo Despus de Su Visita  Tambin puede enviarnos un mensaje a travs de Clinical Cytogeneticist. Por lo general respondemos a los mensajes de Psychologist, Sport And Exercise transcurso  de 1 a 2 das hbiles.  Para renovar recetas, por favor pida a su farmacia que se ponga en contacto con nuestra oficina. Randi lakes de fax es [place] [phone].  Si tiene un asunto urgente cuando la clnica est cerrada y que no puede esperar hasta el siguiente da hbil, puede llamar/localizar a su doctor(a) al nmero que aparece a continuacin.   Por favor, tenga en cuenta que aunque hacemos todo lo posible para estar disponibles para asuntos urgentes fuera del horario de [place], no estamos disponibles las 24 horas del da, los 7 [address] de la [place].   Si tiene un problema urgente y no puede comunicarse con nosotros, puede optar por buscar atencin mdica  en el consultorio de su doctor(a), en una clnica privada, en un centro de atencin urgente o en una sala de emergencias.  Si tiene engineer, drilling, por favor llame inmediatamente al 911 o vaya a la sala de emergencias.  Nmeros de bper  - Dr. Hester: [phone]  - Dra. Jackquline: [phone]  - Dr.  Claudene: [phone]   En caso de inclemencias del tiempo, por favor llame a landry capes principal al [phone] para una actualizacin sobre el [place] de cualquier retraso o cierre.  Consejos para la medicacin en dermatologa: Por favor, guarde las cajas en las que vienen los medicamentos de uso tpico para ayudarle a seguir las instrucciones sobre dnde y cmo usarlos. Las farmacias generalmente imprimen las instrucciones del medicamento slo en las cajas y no directamente en los tubos del [place].   Si su medicamento es muy caro, por favor, pngase en contacto con landry rieger llamando al [phone] y presione la opcin 4 o envenos un mensaje a travs de Clinical Cytogeneticist.   No podemos decirle cul ser su copago por los medicamentos por adelantado ya que esto es diferente dependiendo de la cobertura de su seguro. Sin embargo, es posible que podamos encontrar un medicamento sustituto a audiological scientist un formulario para que el seguro cubra el medicamento que se considera necesario.   Si se requiere una autorizacin previa para que su compaa de seguros cubra su medicamento, por favor permtanos de 1 a 2 das hbiles para completar este proceso.  Los precios de los medicamentos varan con frecuencia dependiendo del environmental consultant de dnde se surte la receta y alguna farmacias pueden ofrecer precios ms baratos.  El sitio web www.goodrx.com tiene cupones para medicamentos de health and safety inspector. Los precios aqu no tienen en cuenta lo que podra costar con la ayuda del seguro (puede ser ms barato con su seguro), pero el sitio web puede darle el precio si no utiliz tourist information centre manager.  - Puede imprimir el cupn correspondiente y llevarlo con su receta a la farmacia.  - Tambin puede pasar por nuestra oficina durante el horario de atencin regular y education officer, museum una tarjeta de cupones de GoodRx.  - Si necesita que su receta se enve electrnicamente a una farmacia diferente, informe a nuestra oficina a  travs de MyChart de [place] o por telfono llamando al [phone] y presione la opcin 4.

## 2023-08-19 ENCOUNTER — Telehealth: Payer: Self-pay

## 2023-08-19 NOTE — Telephone Encounter (Signed)
 Advised pt if her cheilitis does not improve in a month to f/u with Dr. Annette Barters.  Patient wanted to go ahead and schedule 1 month f/u incase no improvement b/c she is planning a cruise in about 6 wks.  Scheduled pt for 55m f/u./sh

## 2023-08-21 LAB — SURGICAL PATHOLOGY

## 2023-08-24 ENCOUNTER — Telehealth: Payer: Self-pay

## 2023-08-24 NOTE — Telephone Encounter (Signed)
Advised patient biopsy of the left flank was benign, no further treatment needed.

## 2023-08-24 NOTE — Telephone Encounter (Signed)
-----   Message from Willeen Niece sent at 08/24/2023  1:43 PM EST ----- 1. Skin, left flank :       HEMANGIOMA   Benign - please call patient

## 2023-08-26 ENCOUNTER — Other Ambulatory Visit: Payer: Self-pay

## 2023-08-26 MED ORDER — TRETINOIN 0.05 % EX CREA: TOPICAL_CREAM | CUTANEOUS | 2 refills | Status: AC

## 2023-08-26 NOTE — Progress Notes (Signed)
Per pharmacy, they will only cover 20g tubes for first fills. Aw

## 2023-09-17 ENCOUNTER — Ambulatory Visit (INDEPENDENT_AMBULATORY_CARE_PROVIDER_SITE_OTHER): Payer: Medicare Other | Admitting: Dermatology

## 2023-09-17 DIAGNOSIS — K130 Diseases of lips: Secondary | ICD-10-CM

## 2023-09-17 NOTE — Patient Instructions (Addendum)
Continue plain Vaseline / Aquaphor ointment during the day  Continue tacrolimus 0.1% ointment Apply to lower lip once nightly as needed   Avoid licking/biting area  Due to recent changes in healthcare laws, you may see results of your pathology and/or laboratory studies on MyChart before the doctors have had a chance to review them. We understand that in some cases there may be results that are confusing or concerning to you. Please understand that not all results are received at the same time and often the doctors may need to interpret multiple results in order to provide you with the best plan of care or course of treatment. Therefore, we ask that you please give [place] 2 business days to thoroughly review all your results before contacting the office for clarification. Should we see a critical lab result, you will be contacted sooner.   If You Need Anything After Your Visit  If you have any questions or concerns for your doctor, please call our main line at [phone] and press option 4 to reach your doctor's medical assistant. If no one answers, please leave a voicemail as directed and we will return your call as soon as possible. Messages left after 4 pm will be answered the following business day.   You may also send [place] a message via MyChart. We typically respond to MyChart messages within 1-2 business days.  For prescription refills, please ask your pharmacy to contact our office. Our fax number is [phone].  If you have an urgent issue when the clinic is closed that cannot wait until the next business day, you can page your doctor at the number below.    Please note that while we do our best to be available for urgent issues outside of office hours, we are not available 24/7.   If you have an urgent issue and are unable to reach [place], you may choose to seek medical care at your doctor's office, retail clinic, urgent care center, or emergency room.  If you have a medical emergency,  please immediately call 911 or go to the emergency department.  Pager Numbers  - Dr. Gwen Pounds: [phone]  - Dr. Roseanne Reno: [phone]  - Dr. Katrinka Blazing: [phone]   In the event of inclement weather, please call our main line at [phone] for an update on the status of any delays or closures.  Dermatology Medication Tips: Please keep the boxes that topical medications come in in order to help keep track of the instructions about where and how to use these. Pharmacies typically print the medication instructions only on the boxes and not directly on the medication tubes.   If your medication is too expensive, please contact our office at [phone] option 4 or send [place] a message through MyChart.   We are unable to tell what your co-pay for medications will be in advance as this is different depending on your insurance coverage. However, we may be able to find a substitute medication at lower cost or fill out paperwork to get insurance to cover a needed medication.   If a prior authorization is required to get your medication covered by your insurance company, please allow [place] 1-2 business days to complete this process.  Drug prices often vary depending on where the prescription is filled and some pharmacies may offer cheaper prices.  The website www.goodrx.com contains coupons for medications through different pharmacies. The prices here do not account for what the cost may be with help from insurance (it may be cheaper with  your insurance), but the website can give you the price if you did not use any insurance.  - You can print the associated coupon and take it with your prescription to the pharmacy.  - You may also stop by our office during regular business hours and pick up a GoodRx coupon card.  - If you need your prescription sent electronically to a different pharmacy, notify our office through [facility] or by phone at [phone] option 4.     Si Usted Necesita  Algo Despus de Su Visita  Tambin puede enviarnos un mensaje a travs de Clinical cytogeneticist. Por lo general respondemos a los mensajes de MyChart en el transcurso de 1 a 2 das hbiles.  Para renovar recetas, por favor pida a su farmacia que se ponga en contacto con nuestra oficina. Annie Sable de fax es [place] [phone].  Si tiene un asunto urgente cuando la clnica est cerrada y que no puede esperar hasta el siguiente da hbil, puede llamar/localizar a su doctor(a) al nmero que aparece a continuacin.   Por favor, tenga en cuenta que aunque hacemos todo lo posible para estar disponibles para asuntos urgentes fuera del horario de [place], no estamos disponibles las 24 horas del da, los 7 [address]  [address] de la [place].   Si tiene un problema urgente y no puede comunicarse con nosotros, puede optar por buscar atencin mdica  en el consultorio de su doctor(a), en una clnica privada, en un centro de atencin urgente o en una sala de emergencias.  Si tiene Engineer, drilling, por favor llame inmediatamente al 911 o vaya a la sala de emergencias.  Nmeros de bper  - Dr. Gwen Pounds: [phone]  - Dra. Roseanne Reno: [phone]  - Dr. Katrinka Blazing: [phone]   En caso de inclemencias del tiempo, por favor llame a Lacy Duverney principal al [phone] para una actualizacin sobre el [place] de cualquier retraso o cierre.  Consejos para la medicacin en dermatologa: Por favor, guarde las cajas en las que vienen los medicamentos de uso tpico para ayudarle a seguir las instrucciones sobre dnde y cmo usarlos. Las farmacias generalmente imprimen las instrucciones del medicamento slo en las cajas y no directamente en los tubos del [place].   Si su medicamento es muy caro, por favor, pngase en contacto con Rolm Gala llamando al [phone] y presione la opcin 4 o envenos un mensaje a travs de Clinical cytogeneticist.   No podemos decirle cul ser su copago por los medicamentos por adelantado ya que esto es  diferente dependiendo de la cobertura de su seguro. Sin embargo, es posible que podamos encontrar un medicamento sustituto a Audiological scientist un formulario para que el seguro cubra el medicamento que se considera necesario.   Si se requiere una autorizacin previa para que su compaa de seguros [place] su medicamento, por favor permtanos de 1 a 2 das hbiles para completar [address]  Los precios de los medicamentos varan con frecuencia dependiendo del Environmental consultant de dnde se surte la receta y alguna farmacias pueden ofrecer precios ms baratos.  El sitio web www.goodrx.com tiene cupones para medicamentos de Health and safety inspector. Los precios aqu no tienen en cuenta lo que podra costar con la ayuda del seguro (puede ser ms barato con su seguro), pero el sitio web puede darle el precio si no utiliz Tourist information centre manager.  - Puede imprimir el cupn correspondiente y llevarlo con su receta a la farmacia.  - Tambin puede pasar por nuestra oficina durante el horario de atencin regular y Librarian, academic  tarjeta de cupones de GoodRx.  - Si necesita que su receta se enve electrnicamente a una farmacia diferente, informe a nuestra oficina a travs de MyChart de [place] o por telfono llamando al [phone] y presione la opcin 4.

## 2023-09-17 NOTE — Progress Notes (Signed)
   Follow-Up Visit   Subjective  Kayla Spencer is a 73 y.o. female who presents for the following: follow up on cheilitis  The patient has spots, moles and lesions to be evaluated, some may be new or changing and the patient may have concern these could be cancer.   The following portions of the chart were reviewed this encounter and updated as appropriate: medications, allergies, medical history  Review of Systems:  No other skin or systemic complaints except as noted in HPI or Assessment and Plan.  Objective  Well appearing patient in no apparent distress; mood and affect are within normal limits.   A focused examination was performed of the following areas: B/l lips  Relevant exam findings are noted in the Assessment and Plan.    Assessment & Plan    CHEILITIS   Exam :  Lower lip well healed, no abnormality today, Improved with tacrolimus bid    Chronic condition with duration or expected duration over one year. Currently well-controlled.    Treatment Plan Continue plain Vaseline ointment during the day,  Or Aquaphor, samples given   Decrease tacrolimus 0.1% ointment to once daily. Apply to lower lip nightly, if clearance maintained, can continue using 3 nights weekly to prevent recurrence  Avoid licking/biting area    Return for keep follow up as scheduled 09/2024.  I, Asher Muir, CMA, am acting as scribe for Willeen Niece, MD.   Documentation: I have reviewed the above documentation for accuracy and completeness, and I agree with the above.  Willeen Niece, MD

## 2023-11-10 ENCOUNTER — Other Ambulatory Visit: Payer: Self-pay | Admitting: Obstetrics and Gynecology

## 2023-11-10 DIAGNOSIS — Z1231 Encounter for screening mammogram for malignant neoplasm of breast: Secondary | ICD-10-CM

## 2023-11-20 ENCOUNTER — Other Ambulatory Visit: Payer: Self-pay | Admitting: Dermatology

## 2024-01-26 ENCOUNTER — Ambulatory Visit
Admission: RE | Admit: 2024-01-26 | Discharge: 2024-01-26 | Disposition: A | Discharge status: Home / Self Care | Source: Ambulatory Visit | Attending: Obstetrics and Gynecology | Admitting: Obstetrics and Gynecology

## 2024-01-26 DIAGNOSIS — Z1231 Encounter for screening mammogram for malignant neoplasm of breast: Secondary | ICD-10-CM | POA: Diagnosis present

## 2024-04-14 ENCOUNTER — Ambulatory Visit

## 2024-04-14 DIAGNOSIS — D1801 Hemangioma of skin and subcutaneous tissue: Secondary | ICD-10-CM

## 2024-04-14 DIAGNOSIS — L814 Other melanin hyperpigmentation: Secondary | ICD-10-CM | POA: Diagnosis not present

## 2024-04-14 DIAGNOSIS — L821 Other seborrheic keratosis: Secondary | ICD-10-CM

## 2024-04-14 DIAGNOSIS — W908XXA Exposure to other nonionizing radiation, initial encounter: Secondary | ICD-10-CM

## 2024-04-14 DIAGNOSIS — Z1283 Encounter for screening for malignant neoplasm of skin: Secondary | ICD-10-CM | POA: Diagnosis not present

## 2024-04-14 DIAGNOSIS — D229 Melanocytic nevi, unspecified: Secondary | ICD-10-CM

## 2024-04-14 DIAGNOSIS — L820 Inflamed seborrheic keratosis: Secondary | ICD-10-CM

## 2024-04-14 DIAGNOSIS — L578 Other skin changes due to chronic exposure to nonionizing radiation: Secondary | ICD-10-CM

## 2024-04-14 DIAGNOSIS — D692 Other nonthrombocytopenic purpura: Secondary | ICD-10-CM

## 2024-04-14 NOTE — Progress Notes (Signed)
 Subjective   Kayla Spencer is a 73 y.o. female who presents for the following: Lesion(s) of concern . Patient is established patient   Today patient reports: Area of concern on her right arm.  Easy bruising on arms   Review of Systems:    No other skin or systemic complaints except as noted in HPI or Assessment and Plan.  The following portions of the chart were reviewed this encounter and updated as appropriate: medications, allergies, medical history  Relevant Medical History:  n/a   Objective  Well appearing patient in no apparent distress; mood and affect are within normal limits. Examination was performed of the: Sun Exposed Exam: Scalp, head, eyes, ears, nose, lips, neck, upper extremities, hands, fingers, fingernails  Examination notable for: SKIN EXAM, Angioma(s): Scattered red vascular papule(s)  , Lentigo/lentigines: Scattered pigmented macules that are tan to brown in color and are somewhat non-uniform in shape and concentrated in the sun-exposed areas, Nevus/nevi: Scattered well-demarcated, regular, pigmented macule(s) and/or papule(s)  , Seborrheic Keratosis(es): Stuck-on appearing keratotic papule(s) on the trunk, some  irritated with redness, crusting, edema, and/or partial avulsion, Actinic Damage/Elastosis: chronic sun damage: dyspigmentation, telangiectasia, and wrinkling  - Actinic Purpura: Diffuse atrophy with scattered purpuric patches 1-3 cm in size on the bilateral forearms and dorsal hands. There is evidence of post-inflammatory hyperpigmentation with an orange-brown color change Examination limited by: Makeup , Clothing, and Patient deferred removal     Right Upper Arm - Posterior Stuck on waxy paps with erythema  Assessment & Plan   SKIN CANCER SCREENING PERFORMED TODAY.  BENIGN SKIN FINDINGS  - Lentigines  - Seborrheic keratoses  - Hemangiomas   - Nevus/Multiple Benign Nevi  - Actinic purpura  - Reassurance provided regarding the benign appearance  of lesions noted on exam today; no treatment is indicated in the absence of symptoms/changes. - Reinforced importance of photoprotective strategies including liberal and frequent sunscreen use of a broad-spectrum SPF 30 or greater, use of protective clothing, and sun avoidance for prevention of cutaneous malignancy and photoaging.  Counseled patient on the importance of regular self-skin monitoring as well as routine clinical skin examinations as scheduled.   ACTINIC DAMAGE - Chronic condition, secondary to cumulative UV/sun exposure - Recommend daily broad spectrum sunscreen SPF 30+ to sun-exposed areas, reapply every 2 hours as needed.  - Staying in the shade or wearing long sleeves, sun glasses (UVA+UVB protection) and wide brim hats (4-inch brim around the entire circumference of the hat) are also recommended for sun protection.  - Call for new or changing lesions.  Seborrheic keratosis, inflamed - Discussed diagnosis, typical course, and treatment options for this condition - Reassurance, benign, monitor - ABCDE's discussed - Given irritation and symptoms, will proceed with cryotherapy as below   Level of service outlined above   Procedures, orders, diagnosis for this visit:  INFLAMED SEBORRHEIC KERATOSIS Right Upper Arm - Posterior Symptomatic, irritating, patient would like treated. Destruction of lesion - Right Upper Arm - Posterior Complexity: simple   Destruction method: cryotherapy   Informed consent: discussed and consent obtained   Timeout:  patient name, date of birth, surgical site, and procedure verified Lesion destroyed using liquid nitrogen: Yes   Region frozen until ice ball extended beyond lesion: Yes   Cryo cycles: 1 or 2. Outcome: patient tolerated procedure well with no complications   Post-procedure details: wound care instructions given   Additional details:  Prior to procedure, discussed risks of blister formation, small wound, skin dyspigmentation, or rare  scar following cryotherapy. Recommend Vaseline ointment to treated areas while healing.    Inflamed seborrheic keratosis -     Destruction of lesion    Return to clinic: Return for Appointment as scheduled.  Documentation:  I, Emerick Ege, CMA am acting as scribe for Lauraine JAYSON Kanaris, MD.   I have reviewed the above documentation for accuracy and completeness, and I agree with the above.  Lauraine JAYSON Kanaris, MD

## 2024-04-14 NOTE — Patient Instructions (Addendum)
 Cryotherapy Aftercare  Wash gently with soap and water everyday.   Apply Vaseline and Band-Aid daily until healed.   Dermend    Due to recent changes in healthcare laws, you may see results of your pathology and/or laboratory studies on MyChart before the doctors have had a chance to review them. We understand that in some cases there may be results that are confusing or concerning to you. Please understand that not all results are received at the same time and often the doctors may need to interpret multiple results in order to provide you with the best plan of care or course of treatment. Therefore, we ask that you please give us  2 business days to thoroughly review all your results before contacting the office for clarification. Should we see a critical lab result, you will be contacted sooner.   If You Need Anything After Your Visit  If you have any questions or concerns for your doctor, please call our main line at [phone] and press option 4 to reach your doctor's medical assistant. If no one answers, please leave a voicemail as directed and we will return your call as soon as possible. Messages left after 4 pm will be answered the following business day.   You may also send us  a message via MyChart. We typically respond to MyChart messages within 1-2 business days.  For prescription refills, please ask your pharmacy to contact our office. Our fax number is [phone].  If you have an urgent issue when the clinic is closed that cannot wait until the next business day, you can page your doctor at the number below.    Please note that while we do our best to be available for urgent issues outside of office hours, we are not available 24/7.   If you have an urgent issue and are unable to reach us , you may choose to seek medical care at your doctor's office, retail clinic, urgent care center, or emergency room.  If you have a medical emergency, please immediately call 911 or go to  the emergency department.  Pager Numbers  - Dr. Hester: [phone]  - Dr. Jackquline: [phone]  - Dr. Claudene: [phone]   - Dr. Raymund: [phone]  In the event of inclement weather, please call our main line at [phone] for an update on the status of any delays or closures.  Dermatology Medication Tips: Please keep the boxes that topical medications come in in order to help keep track of the instructions about where and how to use these. Pharmacies typically print the medication instructions only on the boxes and not directly on the medication tubes.   If your medication is too expensive, please contact our office at [phone] option 4 or send us  a message through MyChart.   We are unable to tell what your co-pay for medications will be in advance as this is different depending on your insurance coverage. However, we may be able to find a substitute medication at lower cost or fill out paperwork to get insurance to cover a needed medication.   If a prior authorization is required to get your medication covered by your insurance company, please allow us  1-2 business days to complete this process.  Drug prices often vary depending on where the prescription is filled and some pharmacies may offer cheaper prices.  The website www.goodrx.com contains coupons for medications through different pharmacies. The prices here do not account for what the cost may be with help from insurance (it may be  cheaper with your insurance), but the website can give you the price if you did not use any insurance.  - You can print the associated coupon and take it with your prescription to the pharmacy.  - You may also stop by our office during regular business hours and pick up a GoodRx coupon card.  - If you need your prescription sent electronically to a different pharmacy, notify our office through [facility] or by phone at [phone] option 4.     Si Usted Necesita Algo  Despus de Su Visita  Tambin puede enviarnos un mensaje a travs de Clinical cytogeneticist. Por lo general respondemos a los mensajes de MyChart en el transcurso de 1 a 2 das hbiles.  Para renovar recetas, por favor pida a su farmacia que se ponga en contacto con nuestra oficina. Randi lakes de fax es [place] [phone].  Si tiene un asunto urgente cuando la clnica est cerrada y que no puede esperar hasta el siguiente da hbil, puede llamar/localizar a su doctor(a) al nmero que aparece a continuacin.   Por favor, tenga en cuenta que aunque hacemos todo lo posible para estar disponibles para asuntos urgentes fuera del horario de [place], no estamos disponibles las 24 horas del da, los 7 [address]  [address] de la [place].   Si tiene un problema urgente y no puede comunicarse con nosotros, puede optar por buscar atencin mdica  en el consultorio de su doctor(a), en una clnica privada, en un centro de atencin urgente o en una sala de emergencias.  Si tiene Engineer, drilling, por favor llame inmediatamente al 911 o vaya a la sala de emergencias.  Nmeros de bper  - Dr. Hester: [phone]  - Dra. Jackquline: [phone]  - Dr. Claudene: [phone]  - Dra. Kitts: [phone]  En caso de inclemencias del [place], por favor llame a nuestra lnea principal al [phone] para una actualizacin sobre el estado de cualquier retraso o cierre.  Consejos para la medicacin en dermatologa: Por favor, guarde las cajas en las que vienen los medicamentos de uso tpico para ayudarle a seguir las instrucciones sobre dnde y cmo usarlos. Las farmacias generalmente imprimen las instrucciones del medicamento slo en las cajas y no directamente en los tubos del [place].   Si su medicamento es muy caro, por favor, pngase en contacto con landry rieger llamando al [phone] y presione la opcin 4 o envenos un mensaje a travs de Clinical cytogeneticist.   No podemos decirle cul ser su copago por los medicamentos por  adelantado ya que esto es diferente dependiendo de la cobertura de su seguro. Sin embargo, es posible que podamos encontrar un medicamento sustituto a Audiological scientist un formulario para que el seguro cubra el medicamento que se considera necesario.   Si se requiere una autorizacin previa para que su compaa de seguros malta su medicamento, por favor permtanos de 1 a 2 das hbiles para completar este proceso.  Los precios de los medicamentos varan con frecuencia dependiendo del Environmental consultant de dnde se surte la receta y alguna farmacias pueden ofrecer precios ms baratos.  El sitio web www.goodrx.com tiene cupones para medicamentos de Health and safety inspector. Los precios aqu no tienen en cuenta lo que podra costar con la ayuda del seguro (puede ser ms barato con su seguro), pero el sitio web puede darle el precio si no utiliz Tourist information centre manager.  - Puede imprimir el cupn correspondiente y llevarlo con su receta a la farmacia.  - Tambin puede pasar por nuestra oficina durante el horario  de atencin regular y Education officer, museum una tarjeta de cupones de GoodRx.  - Si necesita que su receta se enve electrnicamente a una farmacia diferente, informe a nuestra oficina a travs de MyChart de [place] o por telfono llamando al [phone] y presione la opcin 4.

## 2024-09-27 ENCOUNTER — Ambulatory Visit: Payer: Medicare Other | Admitting: Dermatology
